# Patient Record
Sex: Male | Born: 1935 | Race: White | Hispanic: No | Marital: Married | Smoking: Never smoker
Health system: Southern US, Community
[De-identification: ages and names within clinical notes are randomized; demographics above are authoritative.]

## PROBLEM LIST (undated history)

## (undated) DIAGNOSIS — K219 Gastro-esophageal reflux disease without esophagitis: Secondary | ICD-10-CM

## (undated) DIAGNOSIS — M7989 Other specified soft tissue disorders: Secondary | ICD-10-CM

## (undated) DIAGNOSIS — G8929 Other chronic pain: Secondary | ICD-10-CM

## (undated) DIAGNOSIS — N183 Chronic kidney disease, stage 3 unspecified: Secondary | ICD-10-CM

## (undated) DIAGNOSIS — J90 Pleural effusion, not elsewhere classified: Secondary | ICD-10-CM

## (undated) DIAGNOSIS — J45909 Unspecified asthma, uncomplicated: Secondary | ICD-10-CM

## (undated) DIAGNOSIS — M549 Dorsalgia, unspecified: Secondary | ICD-10-CM

## (undated) DIAGNOSIS — R0600 Dyspnea, unspecified: Secondary | ICD-10-CM

## (undated) DIAGNOSIS — J479 Bronchiectasis, uncomplicated: Secondary | ICD-10-CM

---
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
## 2015-07-23 DIAGNOSIS — H5111 Convergence insufficiency: Secondary | ICD-10-CM | POA: Diagnosis not present

## 2015-07-23 DIAGNOSIS — H2513 Age-related nuclear cataract, bilateral: Secondary | ICD-10-CM | POA: Diagnosis not present

## 2015-07-23 DIAGNOSIS — H5052 Exophoria: Secondary | ICD-10-CM | POA: Diagnosis not present

## 2015-07-23 DIAGNOSIS — H43393 Other vitreous opacities, bilateral: Secondary | ICD-10-CM | POA: Diagnosis not present

## 2015-07-23 DIAGNOSIS — H5203 Hypermetropia, bilateral: Secondary | ICD-10-CM | POA: Diagnosis not present

## 2015-07-23 DIAGNOSIS — H18413 Arcus senilis, bilateral: Secondary | ICD-10-CM | POA: Diagnosis not present

## 2015-07-23 DIAGNOSIS — H25013 Cortical age-related cataract, bilateral: Secondary | ICD-10-CM | POA: Diagnosis not present

## 2015-07-23 DIAGNOSIS — H25041 Posterior subcapsular polar age-related cataract, right eye: Secondary | ICD-10-CM | POA: Diagnosis not present

## 2015-07-24 DIAGNOSIS — M5416 Radiculopathy, lumbar region: Secondary | ICD-10-CM | POA: Diagnosis not present

## 2015-07-24 DIAGNOSIS — M47817 Spondylosis without myelopathy or radiculopathy, lumbosacral region: Secondary | ICD-10-CM | POA: Diagnosis not present

## 2015-07-24 DIAGNOSIS — M4806 Spinal stenosis, lumbar region: Secondary | ICD-10-CM | POA: Diagnosis not present

## 2015-07-24 DIAGNOSIS — M6281 Muscle weakness (generalized): Secondary | ICD-10-CM | POA: Diagnosis not present

## 2015-08-21 DIAGNOSIS — I10 Essential (primary) hypertension: Secondary | ICD-10-CM | POA: Diagnosis not present

## 2015-08-21 DIAGNOSIS — G8929 Other chronic pain: Secondary | ICD-10-CM | POA: Diagnosis not present

## 2015-08-21 DIAGNOSIS — D696 Thrombocytopenia, unspecified: Secondary | ICD-10-CM | POA: Diagnosis not present

## 2015-08-21 DIAGNOSIS — E782 Mixed hyperlipidemia: Secondary | ICD-10-CM | POA: Diagnosis not present

## 2015-08-21 DIAGNOSIS — K219 Gastro-esophageal reflux disease without esophagitis: Secondary | ICD-10-CM | POA: Diagnosis not present

## 2015-08-21 DIAGNOSIS — M19012 Primary osteoarthritis, left shoulder: Secondary | ICD-10-CM | POA: Diagnosis not present

## 2015-08-21 DIAGNOSIS — M545 Low back pain: Secondary | ICD-10-CM | POA: Diagnosis not present

## 2015-08-21 DIAGNOSIS — N400 Enlarged prostate without lower urinary tract symptoms: Secondary | ICD-10-CM | POA: Diagnosis not present

## 2015-08-30 DIAGNOSIS — R7989 Other specified abnormal findings of blood chemistry: Secondary | ICD-10-CM | POA: Diagnosis not present

## 2015-08-30 DIAGNOSIS — D6489 Other specified anemias: Secondary | ICD-10-CM | POA: Diagnosis not present

## 2015-09-26 DIAGNOSIS — N183 Chronic kidney disease, stage 3 (moderate): Secondary | ICD-10-CM | POA: Diagnosis not present

## 2015-09-26 DIAGNOSIS — M7989 Other specified soft tissue disorders: Secondary | ICD-10-CM | POA: Diagnosis not present

## 2015-09-26 DIAGNOSIS — N400 Enlarged prostate without lower urinary tract symptoms: Secondary | ICD-10-CM | POA: Diagnosis not present

## 2015-09-26 DIAGNOSIS — I482 Chronic atrial fibrillation: Secondary | ICD-10-CM | POA: Diagnosis not present

## 2015-10-03 DIAGNOSIS — N183 Chronic kidney disease, stage 3 (moderate): Secondary | ICD-10-CM | POA: Diagnosis not present

## 2015-10-03 DIAGNOSIS — M7989 Other specified soft tissue disorders: Secondary | ICD-10-CM | POA: Diagnosis not present

## 2015-10-03 DIAGNOSIS — I129 Hypertensive chronic kidney disease with stage 1 through stage 4 chronic kidney disease, or unspecified chronic kidney disease: Secondary | ICD-10-CM | POA: Diagnosis not present

## 2015-10-22 DIAGNOSIS — I10 Essential (primary) hypertension: Secondary | ICD-10-CM | POA: Diagnosis not present

## 2015-10-22 DIAGNOSIS — D649 Anemia, unspecified: Secondary | ICD-10-CM | POA: Diagnosis not present

## 2015-10-22 DIAGNOSIS — I4891 Unspecified atrial fibrillation: Secondary | ICD-10-CM | POA: Diagnosis not present

## 2015-10-22 DIAGNOSIS — I499 Cardiac arrhythmia, unspecified: Secondary | ICD-10-CM | POA: Diagnosis not present

## 2015-10-22 DIAGNOSIS — N400 Enlarged prostate without lower urinary tract symptoms: Secondary | ICD-10-CM | POA: Diagnosis not present

## 2015-10-22 DIAGNOSIS — R627 Adult failure to thrive: Secondary | ICD-10-CM | POA: Diagnosis not present

## 2015-10-22 DIAGNOSIS — M150 Primary generalized (osteo)arthritis: Secondary | ICD-10-CM | POA: Diagnosis not present

## 2015-10-22 DIAGNOSIS — R001 Bradycardia, unspecified: Secondary | ICD-10-CM | POA: Diagnosis not present

## 2015-10-22 DIAGNOSIS — R002 Palpitations: Secondary | ICD-10-CM | POA: Diagnosis not present

## 2015-10-22 DIAGNOSIS — R609 Edema, unspecified: Secondary | ICD-10-CM | POA: Diagnosis not present

## 2015-10-29 DIAGNOSIS — N138 Other obstructive and reflux uropathy: Secondary | ICD-10-CM | POA: Diagnosis not present

## 2015-10-29 DIAGNOSIS — N401 Enlarged prostate with lower urinary tract symptoms: Secondary | ICD-10-CM | POA: Diagnosis not present

## 2015-10-30 DIAGNOSIS — N401 Enlarged prostate with lower urinary tract symptoms: Secondary | ICD-10-CM | POA: Diagnosis not present

## 2015-10-30 DIAGNOSIS — N138 Other obstructive and reflux uropathy: Secondary | ICD-10-CM | POA: Diagnosis not present

## 2015-11-04 DIAGNOSIS — E7801 Familial hypercholesterolemia: Secondary | ICD-10-CM | POA: Diagnosis not present

## 2015-11-04 DIAGNOSIS — R001 Bradycardia, unspecified: Secondary | ICD-10-CM | POA: Diagnosis not present

## 2015-11-04 DIAGNOSIS — I10 Essential (primary) hypertension: Secondary | ICD-10-CM | POA: Diagnosis not present

## 2015-11-04 DIAGNOSIS — M5416 Radiculopathy, lumbar region: Secondary | ICD-10-CM | POA: Diagnosis not present

## 2015-11-06 DIAGNOSIS — N401 Enlarged prostate with lower urinary tract symptoms: Secondary | ICD-10-CM | POA: Diagnosis not present

## 2015-12-05 DIAGNOSIS — R071 Chest pain on breathing: Secondary | ICD-10-CM | POA: Diagnosis not present

## 2015-12-05 DIAGNOSIS — J918 Pleural effusion in other conditions classified elsewhere: Secondary | ICD-10-CM | POA: Diagnosis not present

## 2015-12-09 DIAGNOSIS — R071 Chest pain on breathing: Secondary | ICD-10-CM | POA: Diagnosis not present

## 2015-12-10 DIAGNOSIS — M4727 Other spondylosis with radiculopathy, lumbosacral region: Secondary | ICD-10-CM | POA: Diagnosis not present

## 2015-12-10 DIAGNOSIS — M4806 Spinal stenosis, lumbar region: Secondary | ICD-10-CM | POA: Diagnosis not present

## 2015-12-10 DIAGNOSIS — M4310 Spondylolisthesis, site unspecified: Secondary | ICD-10-CM | POA: Diagnosis not present

## 2015-12-10 DIAGNOSIS — M5416 Radiculopathy, lumbar region: Secondary | ICD-10-CM | POA: Diagnosis not present

## 2015-12-12 DIAGNOSIS — R0989 Other specified symptoms and signs involving the circulatory and respiratory systems: Secondary | ICD-10-CM | POA: Diagnosis not present

## 2015-12-12 DIAGNOSIS — R918 Other nonspecific abnormal finding of lung field: Secondary | ICD-10-CM | POA: Diagnosis not present

## 2015-12-12 DIAGNOSIS — J90 Pleural effusion, not elsewhere classified: Secondary | ICD-10-CM | POA: Diagnosis not present

## 2015-12-12 DIAGNOSIS — R938 Abnormal findings on diagnostic imaging of other specified body structures: Secondary | ICD-10-CM | POA: Diagnosis not present

## 2015-12-12 DIAGNOSIS — R911 Solitary pulmonary nodule: Secondary | ICD-10-CM | POA: Diagnosis not present

## 2015-12-19 DIAGNOSIS — M47816 Spondylosis without myelopathy or radiculopathy, lumbar region: Secondary | ICD-10-CM | POA: Diagnosis not present

## 2015-12-19 DIAGNOSIS — M4727 Other spondylosis with radiculopathy, lumbosacral region: Secondary | ICD-10-CM | POA: Diagnosis not present

## 2015-12-19 DIAGNOSIS — M4806 Spinal stenosis, lumbar region: Secondary | ICD-10-CM | POA: Diagnosis not present

## 2015-12-19 DIAGNOSIS — M5416 Radiculopathy, lumbar region: Secondary | ICD-10-CM | POA: Diagnosis not present

## 2015-12-19 DIAGNOSIS — M47819 Spondylosis without myelopathy or radiculopathy, site unspecified: Secondary | ICD-10-CM | POA: Diagnosis not present

## 2015-12-19 DIAGNOSIS — M4310 Spondylolisthesis, site unspecified: Secondary | ICD-10-CM | POA: Diagnosis not present

## 2015-12-26 DIAGNOSIS — R0989 Other specified symptoms and signs involving the circulatory and respiratory systems: Secondary | ICD-10-CM | POA: Diagnosis not present

## 2015-12-26 DIAGNOSIS — R918 Other nonspecific abnormal finding of lung field: Secondary | ICD-10-CM | POA: Diagnosis not present

## 2015-12-27 DIAGNOSIS — R001 Bradycardia, unspecified: Secondary | ICD-10-CM | POA: Diagnosis not present

## 2015-12-27 DIAGNOSIS — I10 Essential (primary) hypertension: Secondary | ICD-10-CM | POA: Diagnosis not present

## 2015-12-31 DIAGNOSIS — R0989 Other specified symptoms and signs involving the circulatory and respiratory systems: Secondary | ICD-10-CM | POA: Diagnosis not present

## 2015-12-31 DIAGNOSIS — R918 Other nonspecific abnormal finding of lung field: Secondary | ICD-10-CM | POA: Diagnosis not present

## 2015-12-31 DIAGNOSIS — J471 Bronchiectasis with (acute) exacerbation: Secondary | ICD-10-CM | POA: Diagnosis not present

## 2015-12-31 DIAGNOSIS — J90 Pleural effusion, not elsewhere classified: Secondary | ICD-10-CM | POA: Diagnosis not present

## 2016-01-03 DIAGNOSIS — J471 Bronchiectasis with (acute) exacerbation: Secondary | ICD-10-CM | POA: Diagnosis not present

## 2016-01-06 DIAGNOSIS — N183 Chronic kidney disease, stage 3 (moderate): Secondary | ICD-10-CM | POA: Diagnosis not present

## 2016-01-06 DIAGNOSIS — I129 Hypertensive chronic kidney disease with stage 1 through stage 4 chronic kidney disease, or unspecified chronic kidney disease: Secondary | ICD-10-CM | POA: Diagnosis not present

## 2016-01-08 DIAGNOSIS — M4806 Spinal stenosis, lumbar region: Secondary | ICD-10-CM | POA: Diagnosis not present

## 2016-01-08 DIAGNOSIS — M4310 Spondylolisthesis, site unspecified: Secondary | ICD-10-CM | POA: Diagnosis not present

## 2016-01-08 DIAGNOSIS — M4727 Other spondylosis with radiculopathy, lumbosacral region: Secondary | ICD-10-CM | POA: Diagnosis not present

## 2016-01-08 DIAGNOSIS — M5416 Radiculopathy, lumbar region: Secondary | ICD-10-CM | POA: Diagnosis not present

## 2016-01-14 DIAGNOSIS — J471 Bronchiectasis with (acute) exacerbation: Secondary | ICD-10-CM | POA: Diagnosis not present

## 2016-01-14 DIAGNOSIS — J90 Pleural effusion, not elsewhere classified: Secondary | ICD-10-CM | POA: Diagnosis not present

## 2016-01-14 DIAGNOSIS — N183 Chronic kidney disease, stage 3 (moderate): Secondary | ICD-10-CM | POA: Diagnosis not present

## 2016-01-14 DIAGNOSIS — K219 Gastro-esophageal reflux disease without esophagitis: Secondary | ICD-10-CM | POA: Diagnosis not present

## 2016-01-21 DIAGNOSIS — R531 Weakness: Secondary | ICD-10-CM | POA: Diagnosis not present

## 2016-01-21 DIAGNOSIS — R634 Abnormal weight loss: Secondary | ICD-10-CM | POA: Diagnosis not present

## 2016-01-21 DIAGNOSIS — I10 Essential (primary) hypertension: Secondary | ICD-10-CM | POA: Diagnosis not present

## 2016-01-21 DIAGNOSIS — J90 Pleural effusion, not elsewhere classified: Secondary | ICD-10-CM | POA: Diagnosis not present

## 2016-01-21 DIAGNOSIS — N400 Enlarged prostate without lower urinary tract symptoms: Secondary | ICD-10-CM | POA: Diagnosis not present

## 2016-01-21 DIAGNOSIS — E860 Dehydration: Secondary | ICD-10-CM | POA: Diagnosis not present

## 2016-01-21 DIAGNOSIS — R627 Adult failure to thrive: Secondary | ICD-10-CM | POA: Diagnosis not present

## 2016-01-21 DIAGNOSIS — J471 Bronchiectasis with (acute) exacerbation: Secondary | ICD-10-CM | POA: Diagnosis not present

## 2016-01-21 DIAGNOSIS — D6489 Other specified anemias: Secondary | ICD-10-CM | POA: Diagnosis not present

## 2016-01-21 DIAGNOSIS — K219 Gastro-esophageal reflux disease without esophagitis: Secondary | ICD-10-CM | POA: Diagnosis not present

## 2016-01-21 DIAGNOSIS — R630 Anorexia: Secondary | ICD-10-CM | POA: Diagnosis not present

## 2016-01-21 DIAGNOSIS — J029 Acute pharyngitis, unspecified: Secondary | ICD-10-CM | POA: Diagnosis not present

## 2016-01-21 DIAGNOSIS — I481 Persistent atrial fibrillation: Secondary | ICD-10-CM | POA: Diagnosis not present

## 2016-01-23 DIAGNOSIS — J471 Bronchiectasis with (acute) exacerbation: Secondary | ICD-10-CM | POA: Diagnosis not present

## 2016-01-27 DIAGNOSIS — M7662 Achilles tendinitis, left leg: Secondary | ICD-10-CM | POA: Diagnosis not present

## 2016-01-27 DIAGNOSIS — M5416 Radiculopathy, lumbar region: Secondary | ICD-10-CM | POA: Diagnosis not present

## 2016-01-27 DIAGNOSIS — M4727 Other spondylosis with radiculopathy, lumbosacral region: Secondary | ICD-10-CM | POA: Diagnosis not present

## 2016-01-27 DIAGNOSIS — M4310 Spondylolisthesis, site unspecified: Secondary | ICD-10-CM | POA: Diagnosis not present

## 2016-01-27 DIAGNOSIS — M4806 Spinal stenosis, lumbar region: Secondary | ICD-10-CM | POA: Diagnosis not present

## 2016-01-27 DIAGNOSIS — M5137 Other intervertebral disc degeneration, lumbosacral region: Secondary | ICD-10-CM | POA: Diagnosis not present

## 2016-02-05 DIAGNOSIS — J90 Pleural effusion, not elsewhere classified: Secondary | ICD-10-CM | POA: Diagnosis not present

## 2016-02-05 DIAGNOSIS — I481 Persistent atrial fibrillation: Secondary | ICD-10-CM | POA: Diagnosis not present

## 2016-02-05 DIAGNOSIS — J471 Bronchiectasis with (acute) exacerbation: Secondary | ICD-10-CM | POA: Diagnosis not present

## 2016-02-05 DIAGNOSIS — N183 Chronic kidney disease, stage 3 (moderate): Secondary | ICD-10-CM | POA: Diagnosis not present

## 2016-02-12 DIAGNOSIS — I129 Hypertensive chronic kidney disease with stage 1 through stage 4 chronic kidney disease, or unspecified chronic kidney disease: Secondary | ICD-10-CM | POA: Diagnosis not present

## 2016-02-12 DIAGNOSIS — R600 Localized edema: Secondary | ICD-10-CM | POA: Diagnosis not present

## 2016-02-12 DIAGNOSIS — N183 Chronic kidney disease, stage 3 (moderate): Secondary | ICD-10-CM | POA: Diagnosis not present

## 2016-02-12 DIAGNOSIS — E8809 Other disorders of plasma-protein metabolism, not elsewhere classified: Secondary | ICD-10-CM | POA: Diagnosis not present

## 2016-02-20 DIAGNOSIS — R1310 Dysphagia, unspecified: Secondary | ICD-10-CM | POA: Diagnosis not present

## 2016-02-21 DIAGNOSIS — K2970 Gastritis, unspecified, without bleeding: Secondary | ICD-10-CM | POA: Diagnosis not present

## 2016-02-21 DIAGNOSIS — Z791 Long term (current) use of non-steroidal anti-inflammatories (NSAID): Secondary | ICD-10-CM | POA: Diagnosis not present

## 2016-02-21 DIAGNOSIS — K2950 Unspecified chronic gastritis without bleeding: Secondary | ICD-10-CM | POA: Diagnosis not present

## 2016-02-21 DIAGNOSIS — R1310 Dysphagia, unspecified: Secondary | ICD-10-CM | POA: Diagnosis not present

## 2016-02-21 DIAGNOSIS — K2901 Acute gastritis with bleeding: Secondary | ICD-10-CM | POA: Diagnosis not present

## 2016-02-25 DIAGNOSIS — M4806 Spinal stenosis, lumbar region: Secondary | ICD-10-CM | POA: Diagnosis not present

## 2016-02-25 DIAGNOSIS — M4316 Spondylolisthesis, lumbar region: Secondary | ICD-10-CM | POA: Diagnosis not present

## 2016-02-25 DIAGNOSIS — I10 Essential (primary) hypertension: Secondary | ICD-10-CM | POA: Diagnosis not present

## 2016-02-26 DIAGNOSIS — R1313 Dysphagia, pharyngeal phase: Secondary | ICD-10-CM | POA: Diagnosis not present

## 2016-02-26 DIAGNOSIS — K219 Gastro-esophageal reflux disease without esophagitis: Secondary | ICD-10-CM | POA: Diagnosis not present

## 2016-02-26 DIAGNOSIS — R1310 Dysphagia, unspecified: Secondary | ICD-10-CM | POA: Diagnosis not present

## 2016-02-27 DIAGNOSIS — K2901 Acute gastritis with bleeding: Secondary | ICD-10-CM | POA: Diagnosis not present

## 2016-02-27 DIAGNOSIS — R1313 Dysphagia, pharyngeal phase: Secondary | ICD-10-CM | POA: Diagnosis not present

## 2016-03-05 DIAGNOSIS — R05 Cough: Secondary | ICD-10-CM | POA: Diagnosis not present

## 2016-03-05 DIAGNOSIS — T17998A Other foreign object in respiratory tract, part unspecified causing other injury, initial encounter: Secondary | ICD-10-CM | POA: Diagnosis not present

## 2016-03-05 DIAGNOSIS — R1310 Dysphagia, unspecified: Secondary | ICD-10-CM | POA: Diagnosis not present

## 2016-03-09 DIAGNOSIS — R600 Localized edema: Secondary | ICD-10-CM | POA: Diagnosis not present

## 2016-03-09 DIAGNOSIS — N183 Chronic kidney disease, stage 3 (moderate): Secondary | ICD-10-CM | POA: Diagnosis not present

## 2016-03-09 DIAGNOSIS — I129 Hypertensive chronic kidney disease with stage 1 through stage 4 chronic kidney disease, or unspecified chronic kidney disease: Secondary | ICD-10-CM | POA: Diagnosis not present

## 2016-03-12 DIAGNOSIS — R1313 Dysphagia, pharyngeal phase: Secondary | ICD-10-CM | POA: Diagnosis not present

## 2016-03-19 DIAGNOSIS — R1313 Dysphagia, pharyngeal phase: Secondary | ICD-10-CM | POA: Diagnosis not present

## 2016-03-24 ENCOUNTER — Other Ambulatory Visit: Payer: Self-pay | Admitting: Neurosurgery

## 2016-03-24 DIAGNOSIS — M5137 Other intervertebral disc degeneration, lumbosacral region: Secondary | ICD-10-CM | POA: Diagnosis not present

## 2016-03-24 DIAGNOSIS — M4316 Spondylolisthesis, lumbar region: Secondary | ICD-10-CM | POA: Diagnosis not present

## 2016-03-26 DIAGNOSIS — T17998A Other foreign object in respiratory tract, part unspecified causing other injury, initial encounter: Secondary | ICD-10-CM | POA: Diagnosis not present

## 2016-03-26 DIAGNOSIS — K2901 Acute gastritis with bleeding: Secondary | ICD-10-CM | POA: Diagnosis not present

## 2016-03-26 DIAGNOSIS — R1313 Dysphagia, pharyngeal phase: Secondary | ICD-10-CM | POA: Diagnosis not present

## 2016-04-02 DIAGNOSIS — N183 Chronic kidney disease, stage 3 (moderate): Secondary | ICD-10-CM | POA: Diagnosis not present

## 2016-04-06 DIAGNOSIS — I129 Hypertensive chronic kidney disease with stage 1 through stage 4 chronic kidney disease, or unspecified chronic kidney disease: Secondary | ICD-10-CM | POA: Diagnosis not present

## 2016-04-06 DIAGNOSIS — R600 Localized edema: Secondary | ICD-10-CM | POA: Diagnosis not present

## 2016-04-06 DIAGNOSIS — N183 Chronic kidney disease, stage 3 (moderate): Secondary | ICD-10-CM | POA: Diagnosis not present

## 2016-04-06 DIAGNOSIS — N179 Acute kidney failure, unspecified: Secondary | ICD-10-CM | POA: Diagnosis not present

## 2016-04-08 ENCOUNTER — Inpatient Hospital Stay (HOSPITAL_COMMUNITY)
Admission: RE | Admit: 2016-04-08 | Discharge: 2016-04-08 | Disposition: A | Discharge status: Home / Self Care | Payer: Self-pay | Source: Ambulatory Visit

## 2016-04-08 NOTE — Pre-Procedure Instructions (Signed)
    Mario Allen  04/08/2016      [facility], [place] [address] [place] [address] Phone: [phone] Fax: [phone]    Your procedure is scheduled on October 20.  Report to [facility] Admitting at 12:30 P.M.  Call this number if you have problems the morning of surgery:  [phone]   Remember:  Do not eat food or drink liquids after midnight.  Take these medicines the morning of surgery with A SIP OF WATER : azithromycin (ZITHROMAX) , finasteride (PROSCAR), metolazone  (ZAROXOLYN), pantoprazole (PROTONIX), IF NEEDED- fluticasone (FLOVENT HFA) BRING INHALER WITH YOU  STOP aspirin, herbal medications, NSAID'S (advil, aleve, ibuprofen, motrin)    Do not wear jewelry.  Do not wear lotions, powders, or colognes, or deoderant.  Do not shave 48 hours prior to surgery.  Men may shave face and neck.  Do not bring valuables to the hospital.  [facility] is not responsible for any belongings or valuables.  Contacts, dentures or bridgework may not be worn into surgery.  Leave your suitcase in the car.  After surgery it may be brought to your room.  For patients admitted to the hospital, discharge time will be determined by your treatment team.  Patients discharged the day of surgery will not be allowed to drive home.   Name and phone number of your driver:    Special instructions:  Preparing for surgery  Please read over the following fact sheets that you were given.

## 2016-04-14 ENCOUNTER — Encounter (HOSPITAL_COMMUNITY): Payer: Self-pay

## 2016-04-14 ENCOUNTER — Encounter (HOSPITAL_COMMUNITY)
Admission: RE | Admit: 2016-04-14 | Discharge: 2016-04-14 | Disposition: A | Discharge status: Home / Self Care | Payer: Medicare Other | Source: Ambulatory Visit | Attending: Neurosurgery | Admitting: Neurosurgery

## 2016-04-14 DIAGNOSIS — M4316 Spondylolisthesis, lumbar region: Secondary | ICD-10-CM

## 2016-04-14 DIAGNOSIS — Z0183 Encounter for blood typing: Secondary | ICD-10-CM

## 2016-04-14 DIAGNOSIS — Z01812 Encounter for preprocedural laboratory examination: Secondary | ICD-10-CM | POA: Insufficient documentation

## 2016-04-14 HISTORY — DX: Unspecified asthma, uncomplicated: J45.909

## 2016-04-14 HISTORY — DX: Other specified soft tissue disorders: M79.89

## 2016-04-14 HISTORY — DX: Gastro-esophageal reflux disease without esophagitis: K21.9

## 2016-04-14 HISTORY — DX: Dyspnea, unspecified: R06.00

## 2016-04-14 HISTORY — DX: Dorsalgia, unspecified: M54.9

## 2016-04-14 HISTORY — DX: Other chronic pain: G89.29

## 2016-04-14 LAB — SURGICAL PCR SCREEN
MRSA, PCR: NEGATIVE
Staphylococcus aureus: NEGATIVE

## 2016-04-14 LAB — CBC
HCT: 36.6 % — ABNORMAL LOW (ref 39.0–52.0)
Hemoglobin: 11.8 g/dL — ABNORMAL LOW (ref 13.0–17.0)
MCH: 27.6 pg (ref 26.0–34.0)
MCHC: 32.2 g/dL (ref 30.0–36.0)
MCV: 85.7 fL (ref 78.0–100.0)
PLATELETS: 208 10*3/uL (ref 150–400)
RBC: 4.27 MIL/uL (ref 4.22–5.81)
RDW: 17.1 % — AB (ref 11.5–15.5)
WBC: 11.9 10*3/uL — ABNORMAL HIGH (ref 4.0–10.5)

## 2016-04-14 LAB — TYPE AND SCREEN
ABO/RH(D): O POS
ANTIBODY SCREEN: NEGATIVE

## 2016-04-14 LAB — BASIC METABOLIC PANEL
ANION GAP: 14 (ref 5–15)
BUN: 75 mg/dL — ABNORMAL HIGH (ref 6–20)
CALCIUM: 9.1 mg/dL (ref 8.9–10.3)
CO2: 30 mmol/L (ref 22–32)
CREATININE: 3.03 mg/dL — AB (ref 0.61–1.24)
Chloride: 96 mmol/L — ABNORMAL LOW (ref 101–111)
GFR, EST AFRICAN AMERICAN: 20 mL/min — AB (ref >60)
GFR, EST NON AFRICAN AMERICAN: 17 mL/min — AB (ref >60)
GLUCOSE: 128 mg/dL — AB (ref 65–99)
Potassium: 2.4 mmol/L — CL (ref 3.5–5.1)
Sodium: 140 mmol/L (ref 135–145)

## 2016-04-14 LAB — ABO/RH: ABO/RH(D): O POS

## 2016-04-14 NOTE — Progress Notes (Signed)
CRITICAL VALUE ALERT  Critical value received:  Potassium 2.4  Date of notification:  04/14/16  Time of notification:  10:18 AM  Critical value read back:Yes.    Nurse who received alert:  Fabian Sharp, RN  MD notified (1st page):  Dr. Saintclair Halsted  Time of first page:  10:18 AM  MD notified (2nd page):  Time of second page:  Responding MD:  Saintclair Halsted  Time MD responded:  10:28 AM

## 2016-04-14 NOTE — Progress Notes (Signed)
Spoke with Lorriane Shire with Dr Saintclair Halsted, notified her of K being 2.4 and Creatinine 3.03.  Lorriane Shire stated that they will address both of those lab values

## 2016-04-14 NOTE — Pre-Procedure Instructions (Signed)
Mario Allen  04/14/2016      [facility], [place] [address] [place] [address] Phone: [phone] Fax: [phone]    Your procedure is scheduled on October 27  Report to [facility] at 1100 A.M.  Call this number if you have problems the morning of surgery:  [phone]   Remember:  Do not eat food or drink liquids after midnight.   Take these medicines the morning of surgery with A SIP OF WATER albuterol,  finasteride (PROSCAR), fluticasone (FLOVENT HFA), pantoprazole (PROTONIX)  7 days prior to surgery STOP taking any Aspirin, Aleve, Naproxen, Ibuprofen, Motrin, Advil, Goody's, BC's, all herbal medications, fish oil, and all vitamins    Do not wear jewelry.  Do not wear lotions, powders, or cologne, or deoderant.   Men may shave face and neck.  Do not bring valuables to the hospital.  [facility] is not responsible for any belongings or valuables.  Contacts, dentures or bridgework may not be worn into surgery.  Leave your suitcase in the car.  After surgery it may be brought to your room.  For patients admitted to the hospital, discharge time will be determined by your treatment team.  Patients discharged the day of surgery will not be allowed to drive home.    Special instructions:   [facility]- Preparing For Surgery  Before surgery, you can play an important role. Because skin is not sterile, your skin needs to be as free of germs as possible. You can reduce the number of germs on your skin by washing with CHG (chlorahexidine gluconate) Soap before surgery.  CHG is an antiseptic cleaner which kills germs and bonds with the skin to continue killing germs even after washing.  Please do not use if you have an allergy to CHG or antibacterial soaps. If your skin becomes reddened/irritated stop using the CHG.  Do not shave (including legs and underarms) for at least 48 hours prior to first  CHG shower. It is OK to shave your face.  Please follow these instructions carefully.   1. Shower the NIGHT BEFORE SURGERY and the MORNING OF SURGERY with CHG.   2. If you chose to wash your hair, wash your hair first as usual with your normal shampoo.  3. After you shampoo, rinse your hair and body thoroughly to remove the shampoo.  4. Use CHG as you would any other liquid soap. You can apply CHG directly to the skin and wash gently with a scrungie or a clean washcloth.   5. Apply the CHG Soap to your body ONLY FROM THE NECK DOWN.  Do not use on open wounds or open sores. Avoid contact with your eyes, ears, mouth and genitals (private parts). Wash genitals (private parts) with your normal soap.  6. Wash thoroughly, paying special attention to the area where your surgery will be performed.  7. Thoroughly rinse your body with warm water from the neck down.  8. DO NOT shower/wash with your normal soap after using and rinsing off the CHG Soap.  9. Pat yourself dry with a CLEAN TOWEL.   10. Wear CLEAN PAJAMAS   11. Place CLEAN SHEETS on your bed the night of your first shower and DO NOT SLEEP WITH PETS.    Day of Surgery: Do not apply any deodorants/lotions. Please wear clean clothes to the hospital/surgery center.      Please read over the following fact sheets  that you were given.

## 2016-04-14 NOTE — Progress Notes (Signed)
PCP - Alehegn Asres Cardiologist - [place] [facility]  Chest x-ray - not needed EKG - 11/04/15 - requesting tracing will need Day of surgery if not recieved Stress Test - unsure requesting if he has had one ECHO - 12/27/15 - requesting results from [place] denies  Will send to anesthesia for review to make sure records received are ok for surgery.  Patient may need clearance for surgery from a cardiac standpoint.  Patient denies any cardiac complications when reviewed medical history    Patient denies shortness of breath, fever, cough and chest pain at PAT appointment

## 2016-04-15 ENCOUNTER — Encounter (HOSPITAL_COMMUNITY): Payer: Self-pay | Admitting: Emergency Medicine

## 2016-04-15 NOTE — Progress Notes (Signed)
Patient called for question about medications.  Spoke with patient about inhaler and instructed him to use it if he needed the the morning since he doesn't use it on a daily basis.  Asked Patient about potassium.  Patient stated that someone called in a prescription for potassium for him

## 2016-04-15 NOTE — Progress Notes (Addendum)
Anesthesia Chart Review:  Pt is an 82 year old male scheduled for L4-5 PLIF on 04/17/2016 with Kary Kos, MD.   - PCP is Wallene Dales, MD.  - Nephrologist is Red Christians, MD - Has seen Wetzel Bjornstad, MD with cardiology x1 11/04/15 for bradycardia - Pulmonologist is Francena Hanly, MD, last office visit 02/05/16 indicates pt has loculated pleural effusion not amenable to thoracentesis.   (Notes for all above providers are in care everywhere)  PMH includes:  HTN, CKD (stage 3), anemia, bronchiectasis, right pleural effusion  Medications include: albuterol, zithromax, iron, flovent, lasix, metolazone, protonix.   Preoperative labs reviewed.   - K 2.4. K was 3.6 on 04/02/16 (care everywhere) - Cr 3.03, BUN 75.  Lorriane Shire in Dr. Windy Carina office notified of abnormal results.  - notes by Dr. Neta Ehlers 04/06/16 indicate pt recently suffered an acute kidney injury in September with Cr ~2.5, but was recovering and Cr was 2.01 on 04/02/16. Baseline Cr appears to be ~1.3.    CXR 12/12/15 (care everywhere):  1. Small right pleural effusion. Possible infiltrate or mass in the right infrahilar region and inferior aspect of the right upper lobe. There are underlying emphysematous changes. 2. No evidence of CHF.Aortic atherosclerosis. 3. If the patient has symptoms of pneumonia, PA and lateral chest  CT chest 12/12/15 (care everywhere): 1. Small, multiloculated right-sided pleural effusion. 2. Anterior inferior right middle lobe collapse/consolidative change with suggestion of bronchiectasis. Favor infection, possibly superimposed upon chronic post infectious scarring. 3. Micro nodularity, most apparent in the right lower lobe. Suspicious for atypical infection, of indeterminate acuity. Larger pulmonary nodules, including the left lower lobe, are most likely subpleural lymph nodes. No follow-up needed if patient is low-risk. 4.Aortic atherosclerosis.  EKG 11/04/15: Sinus rhythm   Echo 12/27/15:  1.  Technically adequate study 2. All chambers are normal in size 3. Mild LVH with normal systolic function. Grade 1 diastolic dysfunction. EF 55-60% 4. Sclerotic aortic valve. Mild AI. 5. Mild MR, TR.  Spoke to [place] in Dr. Windy Carina office. Pt's nephrologist has given ok to proceed with surgery if renal function is improved by morning of surgery. Dr. Neta Ehlers, reduced lasix to once daily and discontinue metolazone entirely. The pt was also prescribed potassium.  Will recheck a BMET DOS.   If labs acceptable DOS, I anticipate pt can proceed as scheduled.   Willeen Cass, FNP-BC [facility] Short Stay Surgical [facility]/Anesthesiology Phone: [phone] 04/16/2016 2:55 PM

## 2016-04-16 DIAGNOSIS — M7138 Other bursal cyst, other site: Secondary | ICD-10-CM | POA: Diagnosis not present

## 2016-04-16 DIAGNOSIS — M4316 Spondylolisthesis, lumbar region: Secondary | ICD-10-CM | POA: Diagnosis not present

## 2016-04-16 DIAGNOSIS — M5137 Other intervertebral disc degeneration, lumbosacral region: Secondary | ICD-10-CM | POA: Diagnosis not present

## 2016-04-16 DIAGNOSIS — M4807 Spinal stenosis, lumbosacral region: Secondary | ICD-10-CM | POA: Diagnosis not present

## 2016-04-17 ENCOUNTER — Inpatient Hospital Stay (HOSPITAL_COMMUNITY): Payer: Medicare Other | Admitting: Emergency Medicine

## 2016-04-17 ENCOUNTER — Inpatient Hospital Stay (HOSPITAL_COMMUNITY): Payer: Medicare Other

## 2016-04-17 ENCOUNTER — Encounter (HOSPITAL_COMMUNITY)
Admission: RE | Disposition: A | Discharge status: Home Health | Payer: Self-pay | Source: Ambulatory Visit | Attending: Neurosurgery

## 2016-04-17 ENCOUNTER — Encounter (HOSPITAL_COMMUNITY): Payer: Self-pay | Admitting: *Deleted

## 2016-04-17 ENCOUNTER — Inpatient Hospital Stay (HOSPITAL_COMMUNITY)
Admission: RE | Admit: 2016-04-17 | Discharge: 2016-04-20 | DRG: 460 | Disposition: A | Discharge status: Home Health | Payer: Medicare Other | Source: Ambulatory Visit | Attending: Neurosurgery | Admitting: Neurosurgery

## 2016-04-17 DIAGNOSIS — J479 Bronchiectasis, uncomplicated: Secondary | ICD-10-CM | POA: Diagnosis present

## 2016-04-17 DIAGNOSIS — M4326 Fusion of spine, lumbar region: Secondary | ICD-10-CM | POA: Diagnosis not present

## 2016-04-17 DIAGNOSIS — Z79899 Other long term (current) drug therapy: Secondary | ICD-10-CM

## 2016-04-17 DIAGNOSIS — Z792 Long term (current) use of antibiotics: Secondary | ICD-10-CM

## 2016-04-17 DIAGNOSIS — R2689 Other abnormalities of gait and mobility: Secondary | ICD-10-CM

## 2016-04-17 DIAGNOSIS — K219 Gastro-esophageal reflux disease without esophagitis: Secondary | ICD-10-CM | POA: Diagnosis present

## 2016-04-17 DIAGNOSIS — Z419 Encounter for procedure for purposes other than remedying health state, unspecified: Secondary | ICD-10-CM

## 2016-04-17 DIAGNOSIS — J45909 Unspecified asthma, uncomplicated: Secondary | ICD-10-CM | POA: Diagnosis present

## 2016-04-17 DIAGNOSIS — Z7951 Long term (current) use of inhaled steroids: Secondary | ICD-10-CM

## 2016-04-17 DIAGNOSIS — M48062 Spinal stenosis, lumbar region with neurogenic claudication: Principal | ICD-10-CM | POA: Diagnosis present

## 2016-04-17 DIAGNOSIS — N183 Chronic kidney disease, stage 3 (moderate): Secondary | ICD-10-CM | POA: Diagnosis present

## 2016-04-17 DIAGNOSIS — M549 Dorsalgia, unspecified: Secondary | ICD-10-CM | POA: Diagnosis not present

## 2016-04-17 DIAGNOSIS — M4316 Spondylolisthesis, lumbar region: Secondary | ICD-10-CM | POA: Diagnosis present

## 2016-04-17 HISTORY — DX: Pleural effusion, not elsewhere classified: J90

## 2016-04-17 HISTORY — DX: Bronchiectasis, uncomplicated: J47.9

## 2016-04-17 HISTORY — DX: Chronic kidney disease, stage 3 (moderate): N18.3

## 2016-04-17 HISTORY — DX: Chronic kidney disease, stage 3 unspecified: N18.30

## 2016-04-17 LAB — BASIC METABOLIC PANEL
ANION GAP: 10 (ref 5–15)
BUN: 67 mg/dL — ABNORMAL HIGH (ref 6–20)
CALCIUM: 9.3 mg/dL (ref 8.9–10.3)
CO2: 27 mmol/L (ref 22–32)
Chloride: 105 mmol/L (ref 101–111)
Creatinine, Ser: 2.25 mg/dL — ABNORMAL HIGH (ref 0.61–1.24)
GFR, EST AFRICAN AMERICAN: 28 mL/min — AB (ref >60)
GFR, EST NON AFRICAN AMERICAN: 25 mL/min — AB (ref >60)
Glucose, Bld: 100 mg/dL — ABNORMAL HIGH (ref 65–99)
POTASSIUM: 3.8 mmol/L (ref 3.5–5.1)
SODIUM: 142 mmol/L (ref 135–145)

## 2016-04-17 SURGERY — POSTERIOR LUMBAR FUSION 1 LEVEL
Anesthesia: General | Site: Spine Lumbar

## 2016-04-17 MED ORDER — LIDOCAINE-EPINEPHRINE 1 %-1:100000 IJ SOLN
INTRAMUSCULAR | Status: AC
  Filled 2016-04-17: qty 1

## 2016-04-17 MED ORDER — ONDANSETRON HCL 4 MG/2ML IJ SOLN
INTRAMUSCULAR | Status: AC
  Filled 2016-04-17: qty 8

## 2016-04-17 MED ORDER — SODIUM CHLORIDE 0.9 % IR SOLN
Status: DC | PRN
  Administered 2016-04-17: 17:00:00

## 2016-04-17 MED ORDER — CHLORHEXIDINE GLUCONATE CLOTH 2 % EX PADS: 6.0000 | MEDICATED_PAD | Freq: Once | CUTANEOUS | Status: DC

## 2016-04-17 MED ORDER — ALBUTEROL SULFATE (2.5 MG/3ML) 0.083% IN NEBU: 3.0000 mL | INHALATION_SOLUTION | Freq: Four times a day (QID) | RESPIRATORY_TRACT | Status: DC | PRN

## 2016-04-17 MED ORDER — CEFAZOLIN SODIUM-DEXTROSE 2-4 GM/100ML-% IV SOLN
2.0000 g | Freq: Two times a day (BID) | INTRAVENOUS | Status: AC
  Administered 2016-04-18 (×2): 2 g via INTRAVENOUS
  Filled 2016-04-17 (×2): qty 100

## 2016-04-17 MED ORDER — LIDOCAINE HCL (CARDIAC) 20 MG/ML IV SOLN
INTRAVENOUS | Status: DC | PRN
  Administered 2016-04-17: 50 mg via INTRAVENOUS

## 2016-04-17 MED ORDER — PROPOFOL 10 MG/ML IV BOLUS
INTRAVENOUS | Status: AC
  Filled 2016-04-17: qty 20

## 2016-04-17 MED ORDER — SUGAMMADEX SODIUM 200 MG/2ML IV SOLN
INTRAVENOUS | Status: AC
  Filled 2016-04-17: qty 2

## 2016-04-17 MED ORDER — FENTANYL CITRATE (PF) 100 MCG/2ML IJ SOLN
INTRAMUSCULAR | Status: AC
  Filled 2016-04-17: qty 2

## 2016-04-17 MED ORDER — THROMBIN 20000 UNITS EX SOLR
CUTANEOUS | Status: DC | PRN
  Administered 2016-04-17: 17:00:00 via TOPICAL

## 2016-04-17 MED ORDER — LIDOCAINE 2% (20 MG/ML) 5 ML SYRINGE
INTRAMUSCULAR | Status: AC
  Filled 2016-04-17: qty 15

## 2016-04-17 MED ORDER — ONDANSETRON HCL 4 MG/2ML IJ SOLN
4.0000 mg | INTRAMUSCULAR | Status: DC | PRN
Start: 2016-04-17 — End: 2016-04-20

## 2016-04-17 MED ORDER — SODIUM CHLORIDE 0.9% FLUSH: 3.0000 mL | INTRAVENOUS | Status: DC | PRN

## 2016-04-17 MED ORDER — FENTANYL CITRATE (PF) 100 MCG/2ML IJ SOLN: 25.0000 ug | INTRAMUSCULAR | Status: DC | PRN

## 2016-04-17 MED ORDER — ALUM & MAG HYDROXIDE-SIMETH 200-200-20 MG/5ML PO SUSP: 30.0000 mL | Freq: Four times a day (QID) | ORAL | Status: DC | PRN

## 2016-04-17 MED ORDER — SUGAMMADEX SODIUM 500 MG/5ML IV SOLN
INTRAVENOUS | Status: AC
  Filled 2016-04-17: qty 10

## 2016-04-17 MED ORDER — PANTOPRAZOLE SODIUM 40 MG PO TBEC
40.0000 mg | DELAYED_RELEASE_TABLET | Freq: Every day | ORAL | Status: DC
  Administered 2016-04-18 – 2016-04-20 (×3): 40 mg via ORAL
  Filled 2016-04-17 (×3): qty 1

## 2016-04-17 MED ORDER — VANCOMYCIN HCL 1000 MG IV SOLR
INTRAVENOUS | Status: DC | PRN
  Administered 2016-04-17: 1000 mg

## 2016-04-17 MED ORDER — TAMSULOSIN HCL 0.4 MG PO CAPS
0.4000 mg | ORAL_CAPSULE | Freq: Every evening | ORAL | Status: DC
  Administered 2016-04-17 – 2016-04-19 (×3): 0.4 mg via ORAL
  Filled 2016-04-17 (×3): qty 1

## 2016-04-17 MED ORDER — EPHEDRINE 5 MG/ML INJ
INTRAVENOUS | Status: AC
  Filled 2016-04-17: qty 20

## 2016-04-17 MED ORDER — VANCOMYCIN HCL 1000 MG IV SOLR
INTRAVENOUS | Status: AC
  Filled 2016-04-17: qty 1000

## 2016-04-17 MED ORDER — 0.9 % SODIUM CHLORIDE (POUR BTL) OPTIME
TOPICAL | Status: DC | PRN
  Administered 2016-04-17: 1000 mL

## 2016-04-17 MED ORDER — SUCCINYLCHOLINE CHLORIDE 200 MG/10ML IV SOSY
PREFILLED_SYRINGE | INTRAVENOUS | Status: AC
  Filled 2016-04-17: qty 20

## 2016-04-17 MED ORDER — CEFAZOLIN SODIUM-DEXTROSE 2-4 GM/100ML-% IV SOLN
INTRAVENOUS | Status: AC
  Filled 2016-04-17: qty 100

## 2016-04-17 MED ORDER — ALBUMIN HUMAN 5 % IV SOLN
INTRAVENOUS | Status: DC | PRN
  Administered 2016-04-17: 19:00:00 via INTRAVENOUS

## 2016-04-17 MED ORDER — LIDOCAINE-EPINEPHRINE 1 %-1:100000 IJ SOLN
INTRAMUSCULAR | Status: DC | PRN
Start: 2016-04-17 — End: 2016-04-17
  Administered 2016-04-17: 7 mL

## 2016-04-17 MED ORDER — SUGAMMADEX SODIUM 200 MG/2ML IV SOLN
INTRAVENOUS | Status: DC | PRN
  Administered 2016-04-17: 200 mg via INTRAVENOUS

## 2016-04-17 MED ORDER — PHENYLEPHRINE 40 MCG/ML (10ML) SYRINGE FOR IV PUSH (FOR BLOOD PRESSURE SUPPORT)
PREFILLED_SYRINGE | INTRAVENOUS | Status: AC
  Filled 2016-04-17: qty 30

## 2016-04-17 MED ORDER — FERROUS SULFATE 325 (65 FE) MG PO TABS
325.0000 mg | ORAL_TABLET | Freq: Every day | ORAL | Status: DC
  Administered 2016-04-18 – 2016-04-20 (×3): 325 mg via ORAL
  Filled 2016-04-17 (×3): qty 1

## 2016-04-17 MED ORDER — BUDESONIDE 0.25 MG/2ML IN SUSP
0.2500 mg | Freq: Two times a day (BID) | RESPIRATORY_TRACT | Status: DC
  Administered 2016-04-17 – 2016-04-19 (×5): 0.25 mg via RESPIRATORY_TRACT
  Filled 2016-04-17 (×4): qty 2

## 2016-04-17 MED ORDER — DEXAMETHASONE SODIUM PHOSPHATE 10 MG/ML IJ SOLN
INTRAMUSCULAR | Status: AC
  Filled 2016-04-17: qty 2

## 2016-04-17 MED ORDER — OXYCODONE-ACETAMINOPHEN 5-325 MG PO TABS
1.0000 | ORAL_TABLET | ORAL | Status: DC | PRN
Start: 2016-04-17 — End: 2016-04-20
  Administered 2016-04-18 – 2016-04-20 (×3): 1 via ORAL
  Filled 2016-04-17 (×3): qty 1

## 2016-04-17 MED ORDER — SAW PALMETTO (SERENOA REPENS) 160 MG PO CAPS: 160.0000 mg | ORAL_CAPSULE | Freq: Every day | ORAL | Status: DC

## 2016-04-17 MED ORDER — FUROSEMIDE 40 MG PO TABS
40.0000 mg | ORAL_TABLET | Freq: Every day | ORAL | Status: DC
  Administered 2016-04-18 – 2016-04-20 (×3): 40 mg via ORAL
  Filled 2016-04-17 (×3): qty 1

## 2016-04-17 MED ORDER — PHENYLEPHRINE 40 MCG/ML (10ML) SYRINGE FOR IV PUSH (FOR BLOOD PRESSURE SUPPORT)
PREFILLED_SYRINGE | INTRAVENOUS | Status: AC
  Filled 2016-04-17: qty 10

## 2016-04-17 MED ORDER — PHENOL 1.4 % MT LIQD: 1.0000 | OROMUCOSAL | Status: DC | PRN

## 2016-04-17 MED ORDER — PHENYLEPHRINE HCL 10 MG/ML IJ SOLN
INTRAMUSCULAR | Status: DC | PRN
  Administered 2016-04-17 (×3): 120 ug via INTRAVENOUS

## 2016-04-17 MED ORDER — SODIUM CHLORIDE 0.9 % IV SOLN
INTRAVENOUS | Status: DC
  Administered 2016-04-17 (×2): via INTRAVENOUS
  Administered 2016-04-18: 250 mL via INTRAVENOUS

## 2016-04-17 MED ORDER — PHENYLEPHRINE HCL 10 MG/ML IJ SOLN
INTRAVENOUS | Status: DC | PRN
  Administered 2016-04-17: 50 ug/min via INTRAVENOUS

## 2016-04-17 MED ORDER — ACETAMINOPHEN 650 MG RE SUPP: 650.0000 mg | RECTAL | Status: DC | PRN

## 2016-04-17 MED ORDER — SODIUM CHLORIDE 0.9% FLUSH
3.0000 mL | Freq: Two times a day (BID) | INTRAVENOUS | Status: DC
  Administered 2016-04-17 – 2016-04-19 (×3): 3 mL via INTRAVENOUS

## 2016-04-17 MED ORDER — AZITHROMYCIN 250 MG PO TABS
250.0000 mg | ORAL_TABLET | Freq: Every day | ORAL | Status: DC
  Administered 2016-04-18 – 2016-04-20 (×3): 250 mg via ORAL
  Filled 2016-04-17 (×3): qty 1

## 2016-04-17 MED ORDER — HYDROMORPHONE HCL 1 MG/ML IJ SOLN
0.5000 mg | INTRAMUSCULAR | Status: DC | PRN
Start: 2016-04-17 — End: 2016-04-20

## 2016-04-17 MED ORDER — ONDANSETRON HCL 4 MG/2ML IJ SOLN
INTRAMUSCULAR | Status: DC | PRN
  Administered 2016-04-17: 4 mg via INTRAVENOUS

## 2016-04-17 MED ORDER — SODIUM CHLORIDE 0.9 % IV SOLN: 250.0000 mL | INTRAVENOUS | Status: DC

## 2016-04-17 MED ORDER — BUPIVACAINE LIPOSOME 1.3 % IJ SUSP
20.0000 mL | Freq: Once | INTRAMUSCULAR | Status: DC
  Filled 2016-04-17: qty 20

## 2016-04-17 MED ORDER — MENTHOL 3 MG MT LOZG: 1.0000 | LOZENGE | OROMUCOSAL | Status: DC | PRN

## 2016-04-17 MED ORDER — DOCUSATE SODIUM 100 MG PO CAPS
100.0000 mg | ORAL_CAPSULE | Freq: Two times a day (BID) | ORAL | Status: DC
Start: 2016-04-17 — End: 2016-04-20
  Administered 2016-04-17 – 2016-04-20 (×5): 100 mg via ORAL
  Filled 2016-04-17 (×5): qty 1

## 2016-04-17 MED ORDER — FENTANYL CITRATE (PF) 100 MCG/2ML IJ SOLN
INTRAMUSCULAR | Status: DC | PRN
  Administered 2016-04-17: 25 ug via INTRAVENOUS
  Administered 2016-04-17: 50 ug via INTRAVENOUS
  Administered 2016-04-17: 100 ug via INTRAVENOUS
  Administered 2016-04-17: 25 ug via INTRAVENOUS
  Administered 2016-04-17 (×2): 50 ug via INTRAVENOUS

## 2016-04-17 MED ORDER — FINASTERIDE 5 MG PO TABS
5.0000 mg | ORAL_TABLET | Freq: Every day | ORAL | Status: DC
  Administered 2016-04-18 – 2016-04-20 (×3): 5 mg via ORAL
  Filled 2016-04-17 (×3): qty 1

## 2016-04-17 MED ORDER — ROCURONIUM BROMIDE 100 MG/10ML IV SOLN
INTRAVENOUS | Status: DC | PRN
  Administered 2016-04-17: 50 mg via INTRAVENOUS
  Administered 2016-04-17: 10 mg via INTRAVENOUS

## 2016-04-17 MED ORDER — ACETAMINOPHEN 325 MG PO TABS: 650.0000 mg | ORAL_TABLET | ORAL | Status: DC | PRN

## 2016-04-17 MED ORDER — THROMBIN 20000 UNITS EX SOLR
CUTANEOUS | Status: AC
  Filled 2016-04-17: qty 20000

## 2016-04-17 MED ORDER — BUPIVACAINE LIPOSOME 1.3 % IJ SUSP
INTRAMUSCULAR | Status: DC | PRN
  Administered 2016-04-17: 20 mL

## 2016-04-17 MED ORDER — CEFAZOLIN SODIUM-DEXTROSE 2-4 GM/100ML-% IV SOLN
2.0000 g | INTRAVENOUS | Status: AC
  Administered 2016-04-17: 2 g via INTRAVENOUS

## 2016-04-17 MED ORDER — PROPOFOL 10 MG/ML IV BOLUS
INTRAVENOUS | Status: DC | PRN
  Administered 2016-04-17: 90 mg via INTRAVENOUS

## 2016-04-17 MED ORDER — CALCIUM CARBONATE-VITAMIN D 500-200 MG-UNIT PO TABS
1.0000 | ORAL_TABLET | Freq: Every day | ORAL | Status: DC
  Administered 2016-04-18 – 2016-04-20 (×3): 1 via ORAL
  Filled 2016-04-17 (×3): qty 1

## 2016-04-17 MED ORDER — DEXAMETHASONE SODIUM PHOSPHATE 10 MG/ML IJ SOLN: 10.0000 mg | INTRAMUSCULAR | Status: DC

## 2016-04-17 SURGICAL SUPPLY — 68 items
BAG DECANTER FOR FLEXI CONT (MISCELLANEOUS) ×2 IMPLANT
BENZOIN TINCTURE PRP APPL 2/3 (GAUZE/BANDAGES/DRESSINGS) ×2 IMPLANT
BIT DRILL 5.0/4.0 (BIT) ×1 IMPLANT
BLADE CLIPPER SURG (BLADE) IMPLANT
BLADE SURG 11 STRL SS (BLADE) ×2 IMPLANT
BUR MATCHSTICK NEURO 3.0 LAGG (BURR) ×2 IMPLANT
BUR PRECISION FLUTE 6.0 (BURR) ×2 IMPLANT
CANISTER SUCT 3000ML PPV (MISCELLANEOUS) ×2 IMPLANT
CAP LOCKING (Cap) ×4 IMPLANT
CAP LOCKING 5.5 CREO (Cap) ×4 IMPLANT
CONT SPEC 4OZ CLIKSEAL STRL BL (MISCELLANEOUS) ×2 IMPLANT
COVER BACK TABLE 60X90IN (DRAPES) ×2 IMPLANT
DERMABOND ADVANCED (GAUZE/BANDAGES/DRESSINGS) ×1
DERMABOND ADVANCED .7 DNX12 (GAUZE/BANDAGES/DRESSINGS) ×1 IMPLANT
DRAPE C-ARM 42X72 X-RAY (DRAPES) ×2 IMPLANT
DRAPE C-ARMOR (DRAPES) ×2 IMPLANT
DRAPE HALF SHEET 40X57 (DRAPES) IMPLANT
DRAPE LAPAROTOMY 100X72X124 (DRAPES) ×2 IMPLANT
DRAPE POUCH INSTRU U-SHP 10X18 (DRAPES) ×2 IMPLANT
DRAPE SURG 17X23 STRL (DRAPES) ×2 IMPLANT
DRILL 5.0/4.0 (BIT) ×2
DRSG OPSITE 4X5.5 SM (GAUZE/BANDAGES/DRESSINGS) ×2 IMPLANT
DRSG OPSITE POSTOP 4X6 (GAUZE/BANDAGES/DRESSINGS) ×2 IMPLANT
DURAPREP 26ML APPLICATOR (WOUND CARE) ×2 IMPLANT
ELECT REM PT RETURN 9FT ADLT (ELECTROSURGICAL) ×2
ELECTRODE REM PT RTRN 9FT ADLT (ELECTROSURGICAL) ×1 IMPLANT
EVACUATOR 3/16  PVC DRAIN (DRAIN) ×1
EVACUATOR 3/16 PVC DRAIN (DRAIN) ×1 IMPLANT
GAUZE SPONGE 4X4 12PLY STRL (GAUZE/BANDAGES/DRESSINGS) ×2 IMPLANT
GAUZE SPONGE 4X4 16PLY XRAY LF (GAUZE/BANDAGES/DRESSINGS) IMPLANT
GLOVE BIO SURGEON STRL SZ7 (GLOVE) ×2 IMPLANT
GLOVE BIO SURGEON STRL SZ8 (GLOVE) ×4 IMPLANT
GLOVE BIOGEL PI IND STRL 7.5 (GLOVE) ×5 IMPLANT
GLOVE BIOGEL PI IND STRL 8.5 (GLOVE) ×1 IMPLANT
GLOVE BIOGEL PI INDICATOR 7.5 (GLOVE) ×5
GLOVE BIOGEL PI INDICATOR 8.5 (GLOVE) ×1
GLOVE ECLIPSE 7.5 STRL STRAW (GLOVE) ×2 IMPLANT
GLOVE INDICATOR 8.5 STRL (GLOVE) ×4 IMPLANT
GOWN STRL REUS W/ TWL LRG LVL3 (GOWN DISPOSABLE) ×3 IMPLANT
GOWN STRL REUS W/ TWL XL LVL3 (GOWN DISPOSABLE) ×4 IMPLANT
GOWN STRL REUS W/TWL 2XL LVL3 (GOWN DISPOSABLE) IMPLANT
GOWN STRL REUS W/TWL LRG LVL3 (GOWN DISPOSABLE) ×3
GOWN STRL REUS W/TWL XL LVL3 (GOWN DISPOSABLE) ×4
KIT BASIN OR (CUSTOM PROCEDURE TRAY) ×2 IMPLANT
KIT INFUSE SMALL (Orthopedic Implant) ×2 IMPLANT
KIT ROOM TURNOVER OR (KITS) ×2 IMPLANT
NEEDLE HYPO 25X1 1.5 SAFETY (NEEDLE) ×2 IMPLANT
NS IRRIG 1000ML POUR BTL (IV SOLUTION) ×2 IMPLANT
PACK LAMINECTOMY NEURO (CUSTOM PROCEDURE TRAY) ×2 IMPLANT
PAD ARMBOARD 7.5X6 YLW CONV (MISCELLANEOUS) ×6 IMPLANT
PUTTY BONE DBX 5CC MIX (Putty) ×2 IMPLANT
ROD SPINAL 35MM (Rod) ×4 IMPLANT
SCREW MOD 6.0-5.0X35MM (Screw) ×4 IMPLANT
SCREW PREASSEMBLY 6.0-5.0X35 (Screw) ×4 IMPLANT
SHEET MEDIUM DRAPE 40X70 STRL (DRAPES) ×2 IMPLANT
SPACER SUSTAIN O 10X26 9MM (Spacer) ×4 IMPLANT
SPONGE LAP 4X18 X RAY DECT (DISPOSABLE) IMPLANT
SPONGE SURGIFOAM ABS GEL 100 (HEMOSTASIS) ×2 IMPLANT
STRIP CLOSURE SKIN 1/2X4 (GAUZE/BANDAGES/DRESSINGS) ×4 IMPLANT
STRIP SURGICAL 1/2 X 6 IN (GAUZE/BANDAGES/DRESSINGS) ×2 IMPLANT
SUT VIC AB 0 CT1 18XCR BRD8 (SUTURE) ×1 IMPLANT
SUT VIC AB 0 CT1 8-18 (SUTURE) ×1
SUT VIC AB 2-0 CT1 18 (SUTURE) ×2 IMPLANT
SUT VIC AB 4-0 PS2 27 (SUTURE) ×2 IMPLANT
TOWEL OR 17X24 6PK STRL BLUE (TOWEL DISPOSABLE) ×2 IMPLANT
TOWEL OR 17X26 10 PK STRL BLUE (TOWEL DISPOSABLE) ×2 IMPLANT
TRAY FOLEY W/METER SILVER 16FR (SET/KITS/TRAYS/PACK) ×2 IMPLANT
WATER STERILE IRR 1000ML POUR (IV SOLUTION) ×2 IMPLANT

## 2016-04-17 NOTE — Op Note (Signed)
Preoperative diagnosis: Grade 1 spondylolisthesis severe lumbar spinal stenosis with neurogenic claudication L4-5  Postoperative diagnosis: Same  Procedure: #1 Gill decompressive lumbar laminectomy L4-5 and excess and requiring more work to would be needed with a standard interbody fusion with complete medial facetectomies radical foraminotomies of the L4 and L5 nerve roots.  #2 posterior lumbar interbody fusion L4-5 using globus peek cages packed with locally harvested autograft mixed with DBX mix and BMP.  #3 cortical screw fixation using the globus Creole cortical screw set modular with 35 mm screws 60/50 diameter   #4 posterior lateral arthrodesis and facet fusion L4-5 utilizing locally harvested autograft mixed with DBX mix and BMP.  Surgeon: Dominica Severin Hezakiah Champeau  Asst.: Mallie Mussel pool  Anesthesia: Gen.  EBL: Minimal  History of present illness: Patient is a 82 year old gentleman who has had long-standing back and bilateral leg pain to progress worsening last several weeks and months with progressive difficulty with ambulation and critical spinal stenosis. Workup revealed a grade 1 spondylosis ceases severe spinal stenosis with virtual complete blockage of CSF flow. Patient failed all forms conservative treatment and due to his imaging findings and progressive clinical syndrome I recommended decompression fusion at L4-5. I extensively went over the risks and benefits of the operation with the patient as well as perioperative course expectations of outcome and alternatives of surgery and he understands and agrees to proceed forward.  Operative procedure: Patient brought into the or was induced under general anesthesia positioned prone on the [facility] table his back was prepped and draped in routine sterile fashion progress to localize the appropriate level so after infiltration of 10 mL lidocaine with epi a midline incision was made and Bovie light cautery was used to calcification subperiosteal  dissections care lamina of L4 and L5. Interoperative x-ray confirmed the appropriate level so cortical screw placement was initiated first drilled a pilot hole at the 5 and 7:00 position in both pedicles although is very difficult to get orthogonal C-arm so only place 1 screw that I felt, with inadequate I could visualize I deferred the next cortical screw total after decompression. So then removed the spinous process at L4 and performed a central decompression was severe and marked hourglass compression of thecal sac and dense inflammatory response and a lot of hypertrophied ligament causing severe compression of thecal sac and marked facet arthropathy. Performed complete facetectomies and aggressively under bit the superior tickling process at L5 identified the L4 pedicle and drilled off the inferomedial aspect the L4 pedicle and I decompress the L4 root nerve root marching up the pars. After adequate decompression achieved at both L4 and L5 nerve roots I into the disc space using sequential distraction I worked up from a 7-10 elected to go with 9 mm height peek cages 26 no meters in length cleaned out the disc space bilaterally was a large free fragment on the patient's right side that I teased out from underneath the 4 root and removed all central disc prepared all the endplates inserted with one cage packed local autograft with BMP and DBX mix centrally and packed the contralateral cage. Then after both cages were inserted I placed the 03 cortical screws all screws excellent purchase posterior fluoroscopy confirmed good position of all the implants was a go see her good fixing space was maintained aggressive decortication was carried out over the residual articulating facet and lateral pars that remained and packed BMP and bone graft along the lateral pars and inferior aspect of L3-4 facet the superior articulating facet  of L5. Then inserted the rods anchored all the top tightening nuts explored all the  foramina to confirm patency no migration of graft material laid Gelfoam over the dura place a large Hemovac drain and closed the wound in layers after injecting X Burrell and the fascia and spring cleaning vancomycin powder in the wound. The wounds closed in layers with Vicryl running 4 septic a Dermabond benzo and Steri-Strips and sterile dressing was applied patient recovered in stable condition. At the case all needle counts sponge counts were correct.

## 2016-04-17 NOTE — Anesthesia Postprocedure Evaluation (Signed)
Anesthesia Post Note  Patient: Delane Ginger  Procedure(s) Performed: Procedure(s) (LRB): LUMBAR FOUR-FIVE POSTERIOR LUMBAR FUSION (N/A)  Patient location during evaluation: PACU Anesthesia Type: General Level of consciousness: awake and alert Pain management: pain level controlled Vital Signs Assessment: post-procedure vital signs reviewed and stable Respiratory status: spontaneous breathing, nonlabored ventilation, respiratory function stable and patient connected to nasal cannula oxygen Cardiovascular status: blood pressure returned to baseline and stable Postop Assessment: no signs of nausea or vomiting Anesthetic complications: no    Last Vitals:  Vitals:   04/17/16 1123 04/17/16 2040  BP: (!) 159/78 115/76  Pulse: 83 75  Resp: 16 12  Temp: 36.5 C 36.6 C    Last Pain:  Vitals:   04/17/16 1123  TempSrc: Oral                 Zenaida Deed

## 2016-04-17 NOTE — Progress Notes (Signed)
Pt arrived from PACU around 2200 no pain alert and oriented foley in place and hemo vac, honey comb dressing clean dry and intact will continue to monitor.

## 2016-04-17 NOTE — Anesthesia Procedure Notes (Signed)
Procedure Name: Intubation Date/Time: 04/17/2016 4:33 PM Performed by: Shirlyn Goltz Pre-anesthesia Checklist: Patient identified, Emergency Drugs available, Suction available and Patient being monitored Patient Re-evaluated:Patient Re-evaluated prior to inductionOxygen Delivery Method: Circle system utilized Intubation Type: IV induction Ventilation: Mask ventilation without difficulty Laryngoscope Size: Mac and 3 Grade View: Grade I Tube type: Oral Tube size: 7.0 mm Number of attempts: 1 Airway Equipment and Method: Stylet Placement Confirmation: ETT inserted through vocal cords under direct vision,  positive ETCO2 and breath sounds checked- equal and bilateral Secured at: 23 cm Tube secured with: Tape Dental Injury: Teeth and Oropharynx as per pre-operative assessment

## 2016-04-17 NOTE — Transfer of Care (Signed)
Immediate Anesthesia Transfer of Care Note  Patient: Mario Allen  Procedure(s) Performed: Procedure(s): LUMBAR FOUR-FIVE POSTERIOR LUMBAR FUSION (N/A)  Patient Location: PACU  Anesthesia Type:General  Level of Consciousness: awake, oriented, sedated, patient cooperative and responds to stimulation  Airway & Oxygen Therapy: Patient Spontanous Breathing and Patient connected to nasal cannula oxygen  Post-op Assessment: Report given to RN, Post -op Vital signs reviewed and stable, Patient moving all extremities and Patient moving all extremities X 4  Post vital signs: Reviewed and stable  Last Vitals:  Vitals:   04/17/16 1123  BP: (!) 159/78  Pulse: 83  Resp: 16  Temp: 36.5 C    Last Pain:  Vitals:   04/17/16 1123  TempSrc: Oral      Patients Stated Pain Goal: 5 (73/54/30 1484)  Complications: No apparent anesthesia complications

## 2016-04-17 NOTE — Addendum Note (Signed)
Addendum  created 04/17/16 2151 by Fidela Juneau, CRNA   Anesthesia Intra Meds edited

## 2016-04-17 NOTE — H&P (Signed)
Mario Allen is an 82 y.o. male.   Chief Complaint: Back pain leg pain and neurogenic location HPI: 82 year old with long-standing difficulty walking weakness in his legs workup revealing severe critical spinal stenosis L4-5 patient failed all forms of conservative treatment and due to his progressive conical syndrome severe neurogenic claudication stenosis of a conservative treatment I recommended decompressive laminectomy and fusion at L4-5. I extensively went over the risks and benefits of the operation with the patient as well as perioperative course expectations of outcome and alternatives of surgery and he understands and agrees to proceed forward.  Past Medical History:  Diagnosis Date  . Asthma   . Bronchiectasis ([place])   . Chronic back pain   . CKD (chronic kidney disease), stage III   . Dyspnea   . GERD (gastroesophageal reflux disease)   . Leg swelling   . Pleural effusion    R: 12/12/15    Past Surgical History:  Procedure Laterality Date  . APPENDECTOMY  1942    History reviewed. No pertinent family history. Social History:  reports that he has never smoked. He has never used smokeless tobacco. He reports that he drinks alcohol. He reports that he does not use drugs.  Allergies: No Known Allergies  Medications Prior to Admission  Medication Sig Dispense Refill  . albuterol (PROVENTIL HFA;VENTOLIN HFA) 108 (90 Base) MCG/ACT inhaler Inhale into the lungs every 6 (six) hours as needed for wheezing or shortness of breath.    Marland Kitchen azithromycin (ZITHROMAX) 250 MG tablet Take 250 mg by mouth daily.    . Calcium 500-125 MG-UNIT TABS Take 1 tablet by mouth daily.    . ferrous sulfate 325 (65 FE) MG tablet Take 325 mg by mouth daily with breakfast.    . finasteride (PROSCAR) 5 MG tablet Take 5 mg by mouth daily.    . fluticasone (FLOVENT HFA) 110 MCG/ACT inhaler Inhale 1 puff into the lungs 2 (two) times daily as needed (for exacerbation of breathing issues.).    Marland Kitchen furosemide  (LASIX) 20 MG tablet Take 20 mg by mouth every evening.    . furosemide (LASIX) 40 MG tablet Take 40 mg by mouth daily.    . metolazone (ZAROXOLYN) 2.5 MG tablet Take 2.5 mg by mouth every evening.    . mirtazapine (REMERON) 15 MG tablet Take 15 mg by mouth at bedtime.    . pantoprazole (PROTONIX) 40 MG tablet Take 40 mg by mouth daily.    . saw palmetto 160 MG capsule Take 160 mg by mouth daily.    . Soy Isoflavone 40 MG TABS Take 40 mg by mouth daily.    . tamsulosin (FLOMAX) 0.4 MG CAPS capsule Take 0.4 mg by mouth every evening.      Results for orders placed or performed during the hospital encounter of 04/17/16 (from the past 48 hour(s))  Basic metabolic panel     Status: Abnormal   Collection Time: 04/17/16 11:43 AM  Result Value Ref Range   Sodium 142 135 - 145 mmol/L   Potassium 3.8 3.5 - 5.1 mmol/L   Chloride 105 101 - 111 mmol/L   CO2 27 22 - 32 mmol/L   Glucose, Bld 100 (H) 65 - 99 mg/dL   BUN 67 (H) 6 - 20 mg/dL   Creatinine, Ser 2.25 (H) 0.61 - 1.24 mg/dL   Calcium 9.3 8.9 - 10.3 mg/dL   GFR calc non Af Amer 25 (L) >60 mL/min   GFR calc Af Amer 28 (L) >60 mL/min  Comment: (NOTE) The eGFR has been calculated using the CKD EPI equation. This calculation has not been validated in all clinical situations. eGFR's persistently <60 mL/min signify possible Chronic Kidney Disease.    Anion gap 10 5 - 15   No results found.  Review of Systems  Musculoskeletal: Positive for back pain, falls, joint pain and myalgias.  Neurological: Positive for tingling and sensory change.  All other systems reviewed and are negative.   Blood pressure (!) 159/78, pulse 83, temperature 97.7 F (36.5 C), temperature source Oral, resp. rate 16, height '5\' 10"'$  (1.778 m), weight 50.5 kg (111 lb 5 oz), SpO2 100 %. Physical Exam  Constitutional: He is oriented to person, place, and time. He appears well-developed and well-nourished.  HENT:  Head: Normocephalic.  Eyes: Pupils are equal,  round, and reactive to light.  Neck: Normal range of motion.  Respiratory: Effort normal.  GI: Soft. Bowel sounds are normal.  Neurological: He is alert and oriented to person, place, and time. He has normal strength. GCS eye subscore is 4. GCS verbal subscore is 5. GCS motor subscore is 6.  Strength is 5 out of 5 iliopsoas quads hamstrings gastrocs EHL     Assessment/Plan Decompressive laminectomy and fusion L4-5  Nasteho Glantz P, MD 04/17/2016, 3:39 PM

## 2016-04-17 NOTE — Anesthesia Preprocedure Evaluation (Addendum)
Anesthesia Evaluation  Patient identified by MRN, date of birth, ID band Patient awake    Reviewed: Allergy & Precautions, NPO status , Patient's Chart, lab work & pertinent test results  Airway Mallampati: I  TM Distance: >3 FB Neck ROM: Full    Dental  (+) Teeth Intact, Dental Advisory Given   Pulmonary asthma ,    breath sounds clear to auscultation       Cardiovascular negative cardio ROS   Rhythm:Regular Rate:Normal     Neuro/Psych negative neurological ROS  negative psych ROS   GI/Hepatic Neg liver ROS, GERD  Medicated,  Endo/Other  negative endocrine ROS  Renal/GU CRFRenal disease  negative genitourinary   Musculoskeletal negative musculoskeletal ROS (+)   Abdominal   Peds negative pediatric ROS (+)  Hematology negative hematology ROS (+)   Anesthesia Other Findings   Reproductive/Obstetrics negative OB ROS                            Lab Results  Component Value Date   WBC 11.9 (H) 04/14/2016   HGB 11.8 (L) 04/14/2016   HCT 36.6 (L) 04/14/2016   MCV 85.7 04/14/2016   PLT 208 04/14/2016   Lab Results  Component Value Date   CREATININE 2.25 (H) 04/17/2016   BUN 67 (H) 04/17/2016   NA 142 04/17/2016   K 3.8 04/17/2016   CL 105 04/17/2016   CO2 27 04/17/2016   No results found for: INR, PROTIME  Anesthesia Physical Anesthesia Plan  ASA: II  Anesthesia Plan: General   Post-op Pain Management:    Induction: Intravenous  Airway Management Planned: Oral ETT  Additional Equipment:   Intra-op Plan:   Post-operative Plan: Extubation in OR  Informed Consent: I have reviewed the patients History and Physical, chart, labs and discussed the procedure including the risks, benefits and alternatives for the proposed anesthesia with the patient or authorized representative who has indicated his/her understanding and acceptance.   Dental advisory given  Plan Discussed  with: CRNA  Anesthesia Plan Comments:         Anesthesia Quick Evaluation

## 2016-04-18 ENCOUNTER — Encounter (HOSPITAL_COMMUNITY): Payer: Self-pay | Admitting: General Practice

## 2016-04-18 LAB — BASIC METABOLIC PANEL
ANION GAP: 6 (ref 5–15)
BUN: 48 mg/dL — ABNORMAL HIGH (ref 6–20)
CALCIUM: 8.5 mg/dL — AB (ref 8.9–10.3)
CO2: 29 mmol/L (ref 22–32)
Chloride: 108 mmol/L (ref 101–111)
Creatinine, Ser: 1.69 mg/dL — ABNORMAL HIGH (ref 0.61–1.24)
GFR calc non Af Amer: 35 mL/min — ABNORMAL LOW (ref >60)
GFR, EST AFRICAN AMERICAN: 40 mL/min — AB (ref >60)
GLUCOSE: 97 mg/dL (ref 65–99)
POTASSIUM: 3.8 mmol/L (ref 3.5–5.1)
Sodium: 143 mmol/L (ref 135–145)

## 2016-04-18 NOTE — Evaluation (Addendum)
Physical Therapy Evaluation Patient Details Name: Mario Allen MRN: [id number] DOB: May 16, 1927 Today's Date: 04/18/2016   History of Present Illness  Pt s/p L4-5 fusion. PMH - asthma, ckd  Clinical Impression  Pt admitted with above diagnosis and presents to PT with functional limitations due to deficits listed below (See PT problem list). Pt needs skilled PT to maximize independence and safety to allow discharge to home with wife. Feel pt will need rollator initially at DC for stability of gait. Pt typically very independent. Pt's wife to have knee replacement in the near future so pt needs to return to independence prior to that.     Follow Up Recommendations Home health PT;Supervision - Intermittent    Equipment Recommendations  Other (comment) (rollator)    Recommendations for Other Services       Precautions / Restrictions Precautions Precautions: Back;Fall      Mobility  Bed Mobility               General bed mobility comments: Pt up in chair  Transfers Overall transfer level: Needs assistance Equipment used: None;4-wheeled walker Transfers: Sit to/from Stand Sit to Stand: Min guard         General transfer comment: Assist for safety  Ambulation/Gait Ambulation/Gait assistance: Min guard Ambulation Distance (Feet): 225 Feet Assistive device: 4-wheeled walker Gait Pattern/deviations: Step-through pattern;Decreased stride length;Trunk flexed;Drifts right/left   Gait velocity interpretation: at or above normal speed for age/gender General Gait Details: Assist for balance. Cues to stand more erect.  Stairs Stairs: Yes Stairs assistance: Supervision Stair Management: One rail Right;Step to pattern;Forwards Number of Stairs: 2    Wheelchair Mobility    Modified Rankin (Stroke Patients Only)       Balance Overall balance assessment: Needs assistance Sitting-balance support: No upper extremity supported;Feet supported Sitting balance-Leahy  Scale: Good     Standing balance support: No upper extremity supported Standing balance-Leahy Scale: Fair                               Pertinent Vitals/Pain Pain Assessment: 0-10 Pain Score: 4  Pain Location: incisional Pain Descriptors / Indicators: Operative site guarding Pain Intervention(s): Limited activity within patient's tolerance;Monitored during session    [place] expects to be discharged to:: Private residence Living Arrangements: Spouse/significant other Available Help at Discharge: Family;Available 24 hours/day Type of Home: House Home Access: Stairs to enter Entrance Stairs-Rails: None Entrance Stairs-Number of Steps: 1 or 5 Home Layout: Laundry or work area in [place]: [name] - single point      Prior Function Level of Independence: Independent with assistive device(s)         Comments: Used cane due to leg pain     Hand Dominance        Extremity/Trunk Assessment   Upper Extremity Assessment: Defer to OT evaluation           Lower Extremity Assessment: Generalized weakness         Communication   Communication: No difficulties  Cognition Arousal/Alertness: Awake/alert Behavior During Therapy: WFL for tasks assessed/performed Overall Cognitive Status: Within Functional Limits for tasks assessed                      General Comments      Exercises     Assessment/Plan    PT Assessment Patient needs continued PT services  PT Problem List Decreased strength;Decreased balance;Decreased mobility;Decreased knowledge of use  of DME;Decreased knowledge of precautions          PT Treatment Interventions DME instruction;Gait training;Functional mobility training;Therapeutic activities;Therapeutic exercise;Balance training;Patient/family education    PT Goals (Current goals can be found in the Care Plan section)  Acute Rehab PT Goals Patient Stated Goal: return home PT Goal  Formulation: With patient Time For Goal Achievement: 04/25/16 Potential to Achieve Goals: Good    Frequency Min 6X/week   Barriers to discharge        Co-evaluation               End of Session Equipment Utilized During Treatment: Back brace Activity Tolerance: Patient tolerated treatment well Patient left: in chair;with call bell/phone within reach;with chair alarm set Nurse Communication: Mobility status         Time: 7543-6067 PT Time Calculation (min) (ACUTE ONLY): 18 min   Charges:   PT Evaluation $PT Eval Moderate Complexity: 1 Procedure     PT G Codes:        Tajai Ihde 2016/04/27, 10:24 AM Suanne Marker PT [phone]

## 2016-04-18 NOTE — Progress Notes (Signed)
No acute events Patient feels unsteady when he walks Moving legs well Creatinine better than preop Stable Likely home tomorrow

## 2016-04-19 NOTE — Progress Notes (Signed)
OT Cancellation Note  Patient Details Name: Mario Allen MRN: [id number] DOB: [birth year]   Cancelled Treatment:    Reason Eval/Treat Not Completed: Other (comment). Pt just got his lunch and prefers I come back at a later time.  Almon Register [phone] 04/19/2016, 11:42 AM

## 2016-04-19 NOTE — Progress Notes (Signed)
Postop day 2 reports moderate back pain. No lower extremity pain. No complaints of numbness or weakness. Patient mobilizing reasonably well. Requests one more day in hospital secondary to social issues at home.  Afebrile. Vitals are stable. Awake and alert. Oriented and appropriate. Motor and sensory intact. Wound clean and dry. Drain output moderate.  Progressing well following lumbar fusion. Continue efforts at mobilization. Probable discharge home tomorrow.

## 2016-04-19 NOTE — Progress Notes (Signed)
Physical Therapy Treatment Patient Details Name: Mario Allen MRN: [id number] DOB: 18-Jul-1926 Today's Date: 04/19/2016    History of Present Illness Pt s/p L4-5 fusion. PMH - asthma, ckd    PT Comments    Pt progressing towards physical therapy goals. Awareness of safety is poor and pt appears confused at times, often repeating questions. Unsure what his baseline is. Will continue to follow and progress as able per POC.   Follow Up Recommendations  Home health PT;Supervision - Intermittent     Equipment Recommendations  Other (comment) (rollator)    Recommendations for Other Services       Precautions / Restrictions Precautions Precautions: Back;Fall    Mobility  Bed Mobility               General bed mobility comments: Pt up in chair upon PT arrival  Transfers Overall transfer level: Needs assistance Equipment used: 4-wheeled walker Transfers: Sit to/from Stand Sit to Stand: Min assist         General transfer comment: Pt was able to power-up to full standing position with close guard for safety, however first stand pt became usnteady and "plopped" back into the chair without warning. Pt able to maintain static standing with UE's on 4WW for support.   Ambulation/Gait Ambulation/Gait assistance: Min guard Ambulation Distance (Feet): 250 Feet Assistive device: 4-wheeled walker Gait Pattern/deviations: Step-through pattern;Decreased stride length;Trunk flexed   Gait velocity interpretation: at or above normal speed for age/gender General Gait Details: Assist for balance. Cues to stand more erect.   Stairs Stairs: Yes Stairs assistance: Supervision Stair Management: Two rails;Step to pattern;Forwards Number of Stairs: 5 General stair comments: VC's for general sequencing and safety.   Wheelchair Mobility    Modified Rankin (Stroke Patients Only)       Balance Overall balance assessment: Needs assistance Sitting-balance support: Feet  supported;No upper extremity supported Sitting balance-Leahy Scale: Good     Standing balance support: No upper extremity supported Standing balance-Leahy Scale: Poor                      Cognition Arousal/Alertness: Awake/alert Behavior During Therapy: WFL for tasks assessed/performed Overall Cognitive Status: Within Functional Limits for tasks assessed                      Exercises      General Comments        Pertinent Vitals/Pain Pain Assessment: 0-10 Pain Score: 4  Pain Location: Incision site Pain Descriptors / Indicators: Operative site guarding;Aching Pain Intervention(s): Limited activity within patient's tolerance;Monitored during session;Repositioned    Home Living                      Prior Function            PT Goals (current goals can now be found in the care plan section) Acute Rehab PT Goals Patient Stated Goal: return home PT Goal Formulation: With patient Time For Goal Achievement: 04/25/16 Potential to Achieve Goals: Good Progress towards PT goals: Progressing toward goals    Frequency    Min 6X/week      PT Plan Current plan remains appropriate    Co-evaluation             End of Session Equipment Utilized During Treatment: Back brace;Gait belt Activity Tolerance: Patient tolerated treatment well Patient left: in chair;with call bell/phone within reach;with chair alarm set     Time: 8921-1941 PT Time Calculation (  min) (ACUTE ONLY): 21 min  Charges:  $Gait Training: 8-22 mins                    G Codes:      Rolinda Roan May 19, 2016, 11:58 AM  Rolinda Roan, PT, DPT Acute Rehabilitation Services Pager: [phone]

## 2016-04-19 NOTE — Evaluation (Signed)
Occupational Therapy Evaluation Patient Details Name: Mario Allen MRN: [id number] DOB: 1926-07-30 Today's Date: 04/19/2016    History of Present Illness Pt s/p L4-5 fusion. PMH - asthma, ckd   Clinical Impression   This 82 yo male admitted and underwent above presents to acute OT with deficits below (see OT problem list) thus affecting his PLOF of Mod I to Independent with basic ADLs. He will benefit from acute OT with 24 hour S/prn A and HHOT follow up.    Follow Up Recommendations  Home health OT;Supervision/Assistance - 24 hour    Equipment Recommendations  3 in 1 bedside comode       Precautions / Restrictions Precautions Precautions: Back;Fall Restrictions Weight Bearing Restrictions: No      Mobility Bed Mobility               General bed mobility comments: Pt up in chair upon OT arrival  Transfers Overall transfer level: Needs assistance Equipment used: Rolling walker (2 wheeled) Transfers: Sit to/from Stand Sit to Stand: Min guard         General transfer comment: from recliner with VCs for safe hand placement for sit<>stand    Balance Overall balance assessment: Needs assistance Sitting-balance support: Feet supported;No upper extremity supported Sitting balance-Leahy Scale: Good     Standing balance support: Bilateral upper extremity supported Standing balance-Leahy Scale: Poor Standing balance comment: reliant on RW when up on his feet for safety                            ADL Overall ADL's : Needs assistance/impaired Eating/Feeding: Independent;Sitting   Grooming: Wash/dry hands;Min guard;Standing   Upper Body Bathing: Set up;Supervision/ safety;Sitting   Lower Body Bathing: Moderate assistance (min guard A sit<>stand)   Upper Body Dressing : Minimal assistance;Sitting   Lower Body Dressing: Maximal assistance (min guard A sit<>stand)   Toilet Transfer: Min guard;Ambulation;RW;BSC (over toilet)   Toileting-  Clothing Manipulation and Hygiene: Min guard;Sit to/from stand                         Pertinent Vitals/Pain Pain Assessment: No/denies pain ("I can just feel it")      Hand Dominance Right   Extremity/Trunk Assessment Upper Extremity Assessment Upper Extremity Assessment: Overall WFL for tasks assessed           Communication Communication Communication: No difficulties   Cognition Arousal/Alertness: Awake/alert Behavior During Therapy: WFL for tasks assessed/performed Overall Cognitive Status: Within Functional Limits for tasks assessed       Memory: Decreased recall of precautions (and decreased following precautions (says his wife knows what he is not suppose to do))                        Home Living Family/patient expects to be discharged to:: Private residence Living Arrangements: Spouse/significant other Available Help at Discharge: Family;Available 24 hours/day Type of Home: House Home Access: Stairs to enter [employer] of Steps: 1 or 5 Entrance Stairs-Rails: None Home Layout: Laundry or work area in basement     [employer] Shower/Tub: Walk-in shower;Curtain   Bathroom Toilet: Standard     Home Equipment: Cane - single point          Prior Functioning/Environment Level of Independence: Independent with assistive device(s)        Comments: Used cane due to leg pain  OT Problem List: Impaired balance (sitting and/or standing);Decreased safety awareness;Decreased knowledge of use of DME or AE;Decreased knowledge of precautions   OT Treatment/Interventions: Self-care/ADL training;Patient/family education;DME and/or AE instruction;Balance training    OT Goals(Current goals can be found in the care plan section) Acute Rehab OT Goals Patient Stated Goal: return home OT Goal Formulation: With patient Time For Goal Achievement: 04/26/16 Potential to Achieve Goals: Good  OT Frequency: Min 2X/week               End of Session Equipment Utilized During Treatment: Gait belt;Rolling walker;Back brace  Activity Tolerance: Patient tolerated treatment well Patient left: in chair;with call bell/phone within reach;with chair alarm set   Time: 1246-1300 OT Time Calculation (min): 14 min Charges:  OT General Charges $OT Visit: 1 Procedure OT Evaluation $OT Eval Moderate Complexity: 1 Procedure  Almon Register [phone] 04/19/2016, 1:09 PM

## 2016-04-20 MED ORDER — OXYCODONE-ACETAMINOPHEN 5-325 MG PO TABS: 1.0000 | ORAL_TABLET | ORAL | 0 refills | Status: DC | PRN

## 2016-04-20 NOTE — Discharge Instructions (Signed)
No lifting no bending no twisting no driving °

## 2016-04-20 NOTE — Progress Notes (Signed)
Occupational Therapy Treatment Patient Details Name: Mario Allen MRN: [id number] DOB: 20-Dec-1926 Today's Date: 04/20/2016    History of present illness Pt s/p L4-5 fusion. PMH - asthma, ckd   OT comments  Educated and practiced dressing, donning and doffing back brace, grooming, toilet and shower transfers. Educated pt and wife in multiple uses of 3 in 1 and reinforced back precautions during ADL. Pt has handout to reinforce education. Pt continues to be a high fall risk with posterior lean in standing during ADL. Educated wife on importance of closely guarding pt during standing activities. Pt and wife verbalizing understanding.   Follow Up Recommendations  Home health OT;Supervision/Assistance - 24 hour    Equipment Recommendations  3 in 1 bedside comode    Recommendations for Other Services      Precautions / Restrictions Precautions Precautions: Back;Fall Precaution Booklet Issued: Yes (comment) Precaution Comments: reviewed back precautions related to ADL Required Braces or Orthoses: Spinal Brace Spinal Brace: Lumbar corset Restrictions Weight Bearing Restrictions: No       Mobility Bed Mobility               General bed mobility comments: Pt up in chair upon OT arrival  Transfers Overall transfer level: Needs assistance Equipment used: Rolling walker (2 wheeled)   Sit to Stand: Supervision         General transfer comment: from recliner with VCs for safe hand placement for sit<>stand    Balance     Sitting balance-Leahy Scale: Good       Standing balance-Leahy Scale: Poor Standing balance comment: posterior bias, braces legs on chair                   ADL Overall ADL's : Needs assistance/impaired     Grooming: Min guard;Standing Grooming Details (indicate cue type and reason): educated in two cup method for oral care, may want to sit on rollator or 3 in 1 at sink to shave   Upper Body Bathing Details (indicate cue type and reason):  educated pt to avoid twisting while washing back, recommended long bath sponge Lower Body Bathing: Supervison/ safety;Sitting/lateral leans Lower Body Bathing Details (indicate cue type and reason): recommended sitting on 3 in 1 in shower due to poor balance and use of long bath sponge Upper Body Dressing : Min guard;Standing Upper Body Dressing Details (indicate cue type and reason): posterior lean, blocks LEs on chair, practiced back brace x 2 Lower Body Dressing: Min guard;Sit to/from stand Lower Body Dressing Details (indicate cue type and reason): no AE necessary, pt able to bring legs up Toilet Transfer: Min guard;Ambulation;RW (BSC over toilet)   Toileting- Clothing Manipulation and Hygiene: Min guard;Sit to/from stand Toileting - Clothing Manipulation Details (indicate cue type and reason): instructed to avoid twisting with pericare Tub/ Shower Transfer: Walk-in shower;Min guard;Ambulation;Rolling walker;3 in 1;Grab bars Tub/Shower Transfer Details (indicate cue type and reason): educated in use of 3 in 1 as shower seat, recommended wife supervise shower transfer Functional mobility during ADLs: Min guard;Rolling walker;Cueing for safety General ADL Comments: Educated in limiting sitting to 30 minutes and weight limit.      Vision                     Perception     Praxis      Cognition   Behavior During Therapy: [facility] for tasks assessed/performed Overall Cognitive Status: Within Functional Limits for tasks assessed  Extremity/Trunk Assessment               Exercises     Shoulder Instructions       General Comments      Pertinent Vitals/ Pain       Pain Assessment: No/denies pain Pain Intervention(s): Monitored during session  Home Living                                          Prior Functioning/Environment              Frequency  Min 2X/week        Progress Toward Goals  OT  Goals(current goals can now be found in the care plan section)  Progress towards OT goals: Progressing toward goals  Acute Rehab OT Goals Patient Stated Goal: return home Time For Goal Achievement: 04/26/16 Potential to Achieve Goals: Good  Plan Discharge plan remains appropriate    Co-evaluation                 End of Session Equipment Utilized During Treatment: Gait belt;Rolling walker;Back brace   Activity Tolerance Patient tolerated treatment well   Patient Left in chair;with call bell/phone within reach;with family/visitor present   Nurse Communication  (ready for d/c)        Time: 7737-3668 OT Time Calculation (min): 30 min  Charges: OT General Charges $OT Visit: 1 Procedure OT Treatments $Self Care/Home Management : 23-37 mins  Malka So 04/20/2016, 4:06 PM  [phone]

## 2016-04-20 NOTE — Care Management Note (Signed)
Case Management Note  Patient Details  Name: Mario Allen MRN: [id number] Date of Birth: 1926-12-01  Subjective/Objective:                    Action/Plan: Pt discharging home with orders for DME and [facility] services. CM met with the patient and provided him a list of [place] agencies in the [facility] area. He selected [facility]. CM called and informed them of the referral and faxed the information they requested. Pt also with orders for rollator and 3 in 1. Jermaine with [facility] DME informed and will deliver the equipment to the room. Bedside RN updated.   Expected Discharge Date:                  Expected Discharge Plan:  [place]  In-House Referral:     Discharge planning Services  CM Consult  Post Acute Care Choice:  Durable Medical Equipment, Home Health Choice offered to:  Patient  DME Arranged:  3-N-1, Walker rolling with seat DME Agency:  [place]:  PT, OT [facility] Agency:  Friend  Status of Service:  Completed, signed off  If discussed at [place] of Stay Meetings, dates discussed:    Additional Comments:  Pollie Friar, RN 04/20/2016, 1:11 PM

## 2016-04-20 NOTE — Clinical Social Work Note (Signed)
CSW consulted for New SNF. PT is recommending HHPT. RNCM is following for discharge planning needs. CSW is signing off as no further needs identified.   Darden Dates, MSW, LCSW  Clinical Social Worker  [phone]

## 2016-04-20 NOTE — Discharge Summary (Signed)
Physician Discharge Summary  Patient ID: Mario Allen MRN: [id number] DOB/AGE: 1926-08-16 82 y.o.  Admit date: 04/17/2016 Discharge date: 04/20/2016  Admission Diagnoses:Spinal stenosis grade 1 spondylolisthesis L4-5  Discharge Diagnoses: Same Active Problems:   Spondylolisthesis at L4-L5 level   Discharged Condition: good  Hospital Course: Patient is [facility] underwent decompressive laminectomy and fusion L4-5 postoperatively patient did very well recovered in the floor on the floor was angling and voiding spontaneously tolerating a Stable for discharge home with home health PT a rolling walker and commode seat.  Consults: Significant Diagnostic Studies: Treatments: L4-5 PLIF Discharge Exam: Blood pressure 96/60, pulse 99, temperature 98.4 F (36.9 C), temperature source Oral, resp. rate 20, height 5\' 10"  (1.778 m), weight 50.5 kg (111 lb 5 oz), SpO2 100 %. Strength out of 5 wound clean dry and intact  Disposition: Home  Discharge Instructions    DME Bedside commode    Complete by:  As directed    Face-to-face encounter (required for Medicare/Medicaid patients)    Complete by:  As directed    I Konnor Vondrasek P certify that this patient is under my care and that I, or a nurse practitioner or physician's assistant working with me, had a face-to-face encounter that meets the physician face-to-face encounter requirements with this patient on 04/20/2016. The encounter with the patient was in whole, or in part for the following medical condition(s) which is the primary reason for home health care (List medical condition): lumbar stenosis   The encounter with the patient was in whole, or in part, for the following medical condition, which is the primary reason for home health care:  lumbar stenosis   I certify that, based on my findings, the following services are medically necessary home health services:  Physical therapy   Reason for Medically Necessary Home Health Services:   Therapy- Instruction on use of Assistive Device for Ambulation on all Surfaces   My clinical findings support the need for the above services:  Pain interferes with ambulation/mobility   Further, I certify that my clinical findings support that this patient is homebound due to:  Pain interferes with ambulation/mobility   For home use only DME 4 wheeled rolling walker with seat    Complete by:  As directed    Home Health    Complete by:  As directed    To provide the following care/treatments:   PT OT         Medication List    TAKE these medications   albuterol 108 (90 Base) MCG/ACT inhaler Commonly known as:  PROVENTIL HFA;VENTOLIN HFA Inhale into the lungs every 6 (six) hours as needed for wheezing or shortness of breath.   azithromycin 250 MG tablet Commonly known as:  ZITHROMAX Take 250 mg by mouth daily.   Calcium 500-125 MG-UNIT Tabs Take 1 tablet by mouth daily.   ferrous sulfate 325 (65 FE) MG tablet Take 325 mg by mouth daily with breakfast.   finasteride 5 MG tablet Commonly known as:  PROSCAR Take 5 mg by mouth daily.   fluticasone 110 MCG/ACT inhaler Commonly known as:  FLOVENT HFA Inhale 1 puff into the lungs 2 (two) times daily as needed (for exacerbation of breathing issues.).   furosemide 20 MG tablet Commonly known as:  LASIX Take 20 mg by mouth every evening.   furosemide 40 MG tablet Commonly known as:  LASIX Take 40 mg by mouth daily.   metolazone 2.5 MG tablet Commonly known as:  ZAROXOLYN Take 2.5 mg by  mouth every evening.   mirtazapine 15 MG tablet Commonly known as:  REMERON Take 15 mg by mouth at bedtime.   oxyCODONE-acetaminophen 5-325 MG tablet Commonly known as:  PERCOCET/ROXICET Take 1-2 tablets by mouth every 4 (four) hours as needed for moderate pain.   pantoprazole 40 MG tablet Commonly known as:  PROTONIX Take 40 mg by mouth daily.   saw palmetto 160 MG capsule Take 160 mg by mouth daily.   Soy Isoflavone 40 MG  Tabs Take 40 mg by mouth daily.   tamsulosin 0.4 MG Caps capsule Commonly known as:  FLOMAX Take 0.4 mg by mouth every evening.      Follow-up Information    Atisha Hamidi P, MD Follow up in 2 day(s).   Specialty:  Neurosurgery Contact information: [address] [place] [address] [phone]        Elaina Hoops, MD .   Specialty:  Neurosurgery Contact information: [address] [place] [address] [phone]           Signed: Elaina Hoops 04/20/2016, 4:20 PM

## 2016-04-20 NOTE — Progress Notes (Signed)
Physical Therapy Treatment Patient Details Name: Mario Allen MRN: [id number] DOB: 03/03/1927 Today's Date: 04/20/2016    History of Present Illness Pt s/p L4-5 fusion. PMH - asthma, ckd    PT Comments    Pt admitted with above diagnosis. Pt currently with functional limitations due to balance and endurance deficits as well as safety issues. Pt was able to ambulate with rollator with min guard assist.  Education with wife complete and she states that she can assist pt at all times.  Wife can assist pt for safety.  Pt will benefit from skilled PT to increase their independence and safety with mobility to allow discharge to the venue listed below.    Follow Up Recommendations  Home health PT;Supervision - Intermittent     Equipment Recommendations  Other (comment) (rollator)    Recommendations for Other Services       Precautions / Restrictions Precautions Precautions: Back;Fall Precaution Booklet Issued: Yes (comment) Precaution Comments: reviewed back precautions related to mobility Required Braces or Orthoses: Spinal Brace Spinal Brace: Lumbar corset Restrictions Weight Bearing Restrictions: No    Mobility  Bed Mobility               General bed mobility comments: Pt up in chair upon PT arrival  Transfers Overall transfer level: Needs assistance Equipment used: 4-wheeled walker Transfers: Sit to/from Stand Sit to Stand: Supervision         General transfer comment: from recliner with VCs for safe hand placement for sit<>stand as well as verbal cues to ensure brakes on rollator locked.  Pt recalls precautions 60 % of time.  Ambulation/Gait Ambulation/Gait assistance: Min guard Ambulation Distance (Feet): 300 Feet Assistive device: 4-wheeled walker Gait Pattern/deviations: Step-through pattern;Decreased stride length;Trunk flexed;Drifts right/left   Gait velocity interpretation: at or above normal speed for age/gender General Gait Details: Assist for  balance. Cues to stand more erect.  Pt needed cues to lock rollator to sit and rest.  Also needed assist to stand from rollator and wife aware to guard rollator if pt sits down.  Cues needed to slow down at times.   Stairs   Stairs assistance: Supervision Stair Management: Forwards;Step to pattern;Two rails Number of Stairs: 1 General stair comments: Wife and husband verbalize how to get into house using door jams and wife to move rollator.   Wheelchair Mobility    Modified Rankin (Stroke Patients Only)       Balance Overall balance assessment: Needs assistance Sitting-balance support: No upper extremity supported;Feet supported Sitting balance-Leahy Scale: Good     Standing balance support: Bilateral upper extremity supported;During functional activity Standing balance-Leahy Scale: Poor Standing balance comment: posterior bias.  Needs RW for support.             High level balance activites: Direction changes;Turns;Sudden stops High Level Balance Comments: Needs min guard assist with rollator    Cognition Arousal/Alertness: Awake/alert Behavior During Therapy: WFL for tasks assessed/performed Overall Cognitive Status: Within Functional Limits for tasks assessed                      Exercises      General Comments General comments (skin integrity, edema, etc.): Verbal instuction regarding car transfer and thorough coverage of back handout.       Pertinent Vitals/Pain Pain Assessment: No/denies pain Pain Intervention(s): Monitored during session;Repositioned;Limited activity within patient's tolerance  VSS  Home Living  Prior Function            PT Goals (current goals can now be found in the care plan section) Acute Rehab PT Goals Patient Stated Goal: return home Progress towards PT goals: Progressing toward goals    Frequency    Min 6X/week      PT Plan Current plan remains appropriate    Co-evaluation              End of Session Equipment Utilized During Treatment: Back brace;Gait belt Activity Tolerance: Patient tolerated treatment well Patient left: in chair;with call bell/phone within reach;with chair alarm set;with family/visitor present     Time: 9233-0076 PT Time Calculation (min) (ACUTE ONLY): 30 min  Charges:  $Gait Training: 8-22 mins $Self Care/Home Management: 8-22                    G CodesDenice Allen 04/23/16, 5:26 PM Mario Allen,PT Acute Rehabilitation [phone] (pager)

## 2016-04-20 NOTE — Progress Notes (Signed)
Discharge instructions reviewed with patient/family. RX given at this time. Equipments at bedside. All questions answered at this time. Transport by family.  Ave Filter, RN

## 2016-04-29 DIAGNOSIS — J45909 Unspecified asthma, uncomplicated: Secondary | ICD-10-CM | POA: Diagnosis not present

## 2016-04-29 DIAGNOSIS — Z4789 Encounter for other orthopedic aftercare: Secondary | ICD-10-CM | POA: Diagnosis not present

## 2016-04-29 DIAGNOSIS — Z79891 Long term (current) use of opiate analgesic: Secondary | ICD-10-CM | POA: Diagnosis not present

## 2016-04-29 DIAGNOSIS — N183 Chronic kidney disease, stage 3 (moderate): Secondary | ICD-10-CM | POA: Diagnosis not present

## 2016-05-07 DIAGNOSIS — J90 Pleural effusion, not elsewhere classified: Secondary | ICD-10-CM | POA: Diagnosis not present

## 2016-05-07 DIAGNOSIS — J471 Bronchiectasis with (acute) exacerbation: Secondary | ICD-10-CM | POA: Diagnosis not present

## 2016-05-07 DIAGNOSIS — I481 Persistent atrial fibrillation: Secondary | ICD-10-CM | POA: Diagnosis not present

## 2016-05-20 DIAGNOSIS — Z79891 Long term (current) use of opiate analgesic: Secondary | ICD-10-CM | POA: Diagnosis not present

## 2016-05-20 DIAGNOSIS — N183 Chronic kidney disease, stage 3 (moderate): Secondary | ICD-10-CM | POA: Diagnosis not present

## 2016-05-20 DIAGNOSIS — Z4789 Encounter for other orthopedic aftercare: Secondary | ICD-10-CM | POA: Diagnosis not present

## 2016-05-20 DIAGNOSIS — J45909 Unspecified asthma, uncomplicated: Secondary | ICD-10-CM | POA: Diagnosis not present

## 2016-05-21 DIAGNOSIS — M5136 Other intervertebral disc degeneration, lumbar region: Secondary | ICD-10-CM | POA: Diagnosis not present

## 2016-05-21 DIAGNOSIS — M48062 Spinal stenosis, lumbar region with neurogenic claudication: Secondary | ICD-10-CM | POA: Diagnosis not present

## 2016-05-22 DIAGNOSIS — J45909 Unspecified asthma, uncomplicated: Secondary | ICD-10-CM | POA: Diagnosis not present

## 2016-05-22 DIAGNOSIS — Z4789 Encounter for other orthopedic aftercare: Secondary | ICD-10-CM | POA: Diagnosis not present

## 2016-05-22 DIAGNOSIS — Z79891 Long term (current) use of opiate analgesic: Secondary | ICD-10-CM | POA: Diagnosis not present

## 2016-05-22 DIAGNOSIS — N183 Chronic kidney disease, stage 3 (moderate): Secondary | ICD-10-CM | POA: Diagnosis not present

## 2016-05-27 DIAGNOSIS — Z79891 Long term (current) use of opiate analgesic: Secondary | ICD-10-CM | POA: Diagnosis not present

## 2016-05-27 DIAGNOSIS — N183 Chronic kidney disease, stage 3 (moderate): Secondary | ICD-10-CM | POA: Diagnosis not present

## 2016-05-27 DIAGNOSIS — J45909 Unspecified asthma, uncomplicated: Secondary | ICD-10-CM | POA: Diagnosis not present

## 2016-05-27 DIAGNOSIS — Z4789 Encounter for other orthopedic aftercare: Secondary | ICD-10-CM | POA: Diagnosis not present

## 2016-05-29 DIAGNOSIS — Z4789 Encounter for other orthopedic aftercare: Secondary | ICD-10-CM | POA: Diagnosis not present

## 2016-05-29 DIAGNOSIS — N183 Chronic kidney disease, stage 3 (moderate): Secondary | ICD-10-CM | POA: Diagnosis not present

## 2016-05-29 DIAGNOSIS — J45909 Unspecified asthma, uncomplicated: Secondary | ICD-10-CM | POA: Diagnosis not present

## 2016-05-29 DIAGNOSIS — Z79891 Long term (current) use of opiate analgesic: Secondary | ICD-10-CM | POA: Diagnosis not present

## 2016-06-07 DIAGNOSIS — Z23 Encounter for immunization: Secondary | ICD-10-CM | POA: Diagnosis not present

## 2016-06-19 DIAGNOSIS — N183 Chronic kidney disease, stage 3 (moderate): Secondary | ICD-10-CM | POA: Diagnosis not present

## 2016-06-22 HISTORY — PX: LUMBAR LAMINECTOMY: SHX95

## 2016-06-24 DIAGNOSIS — N179 Acute kidney failure, unspecified: Secondary | ICD-10-CM | POA: Diagnosis not present

## 2016-06-24 DIAGNOSIS — N183 Chronic kidney disease, stage 3 (moderate): Secondary | ICD-10-CM | POA: Diagnosis not present

## 2016-06-24 DIAGNOSIS — I129 Hypertensive chronic kidney disease with stage 1 through stage 4 chronic kidney disease, or unspecified chronic kidney disease: Secondary | ICD-10-CM | POA: Diagnosis not present

## 2016-07-02 DIAGNOSIS — M7138 Other bursal cyst, other site: Secondary | ICD-10-CM | POA: Diagnosis not present

## 2016-07-02 DIAGNOSIS — M48062 Spinal stenosis, lumbar region with neurogenic claudication: Secondary | ICD-10-CM | POA: Diagnosis not present

## 2016-07-02 DIAGNOSIS — M5137 Other intervertebral disc degeneration, lumbosacral region: Secondary | ICD-10-CM | POA: Diagnosis not present

## 2016-07-30 DIAGNOSIS — M6281 Muscle weakness (generalized): Secondary | ICD-10-CM | POA: Diagnosis not present

## 2016-07-30 DIAGNOSIS — R2689 Other abnormalities of gait and mobility: Secondary | ICD-10-CM | POA: Diagnosis not present

## 2016-07-30 DIAGNOSIS — M5137 Other intervertebral disc degeneration, lumbosacral region: Secondary | ICD-10-CM | POA: Diagnosis not present

## 2016-08-03 DIAGNOSIS — R2689 Other abnormalities of gait and mobility: Secondary | ICD-10-CM | POA: Diagnosis not present

## 2016-08-03 DIAGNOSIS — M6281 Muscle weakness (generalized): Secondary | ICD-10-CM | POA: Diagnosis not present

## 2016-08-03 DIAGNOSIS — M5137 Other intervertebral disc degeneration, lumbosacral region: Secondary | ICD-10-CM | POA: Diagnosis not present

## 2016-08-05 DIAGNOSIS — M6281 Muscle weakness (generalized): Secondary | ICD-10-CM | POA: Diagnosis not present

## 2016-08-05 DIAGNOSIS — M5137 Other intervertebral disc degeneration, lumbosacral region: Secondary | ICD-10-CM | POA: Diagnosis not present

## 2016-08-05 DIAGNOSIS — R2689 Other abnormalities of gait and mobility: Secondary | ICD-10-CM | POA: Diagnosis not present

## 2016-08-10 DIAGNOSIS — M6281 Muscle weakness (generalized): Secondary | ICD-10-CM | POA: Diagnosis not present

## 2016-08-10 DIAGNOSIS — R2689 Other abnormalities of gait and mobility: Secondary | ICD-10-CM | POA: Diagnosis not present

## 2016-08-10 DIAGNOSIS — M5137 Other intervertebral disc degeneration, lumbosacral region: Secondary | ICD-10-CM | POA: Diagnosis not present

## 2016-08-12 DIAGNOSIS — R2689 Other abnormalities of gait and mobility: Secondary | ICD-10-CM | POA: Diagnosis not present

## 2016-08-12 DIAGNOSIS — M6281 Muscle weakness (generalized): Secondary | ICD-10-CM | POA: Diagnosis not present

## 2016-08-12 DIAGNOSIS — M5137 Other intervertebral disc degeneration, lumbosacral region: Secondary | ICD-10-CM | POA: Diagnosis not present

## 2016-08-17 DIAGNOSIS — M6281 Muscle weakness (generalized): Secondary | ICD-10-CM | POA: Diagnosis not present

## 2016-08-17 DIAGNOSIS — M5137 Other intervertebral disc degeneration, lumbosacral region: Secondary | ICD-10-CM | POA: Diagnosis not present

## 2016-08-17 DIAGNOSIS — R2689 Other abnormalities of gait and mobility: Secondary | ICD-10-CM | POA: Diagnosis not present

## 2016-08-21 DIAGNOSIS — M5137 Other intervertebral disc degeneration, lumbosacral region: Secondary | ICD-10-CM | POA: Diagnosis not present

## 2016-08-24 DIAGNOSIS — M5137 Other intervertebral disc degeneration, lumbosacral region: Secondary | ICD-10-CM | POA: Diagnosis not present

## 2016-08-25 DIAGNOSIS — M48062 Spinal stenosis, lumbar region with neurogenic claudication: Secondary | ICD-10-CM | POA: Diagnosis not present

## 2016-08-25 DIAGNOSIS — N183 Chronic kidney disease, stage 3 (moderate): Secondary | ICD-10-CM | POA: Diagnosis not present

## 2016-08-26 DIAGNOSIS — M5137 Other intervertebral disc degeneration, lumbosacral region: Secondary | ICD-10-CM | POA: Diagnosis not present

## 2016-08-31 ENCOUNTER — Other Ambulatory Visit: Payer: Self-pay | Admitting: Student

## 2016-08-31 DIAGNOSIS — R269 Unspecified abnormalities of gait and mobility: Secondary | ICD-10-CM

## 2016-09-03 DIAGNOSIS — N183 Chronic kidney disease, stage 3 (moderate): Secondary | ICD-10-CM | POA: Diagnosis not present

## 2016-09-03 DIAGNOSIS — I129 Hypertensive chronic kidney disease with stage 1 through stage 4 chronic kidney disease, or unspecified chronic kidney disease: Secondary | ICD-10-CM | POA: Diagnosis not present

## 2016-09-07 ENCOUNTER — Ambulatory Visit
Admission: RE | Admit: 2016-09-07 | Discharge: 2016-09-07 | Disposition: A | Discharge status: Home / Self Care | Payer: Medicare Other | Source: Ambulatory Visit | Attending: Student | Admitting: Student

## 2016-09-07 DIAGNOSIS — R269 Unspecified abnormalities of gait and mobility: Secondary | ICD-10-CM

## 2016-09-07 DIAGNOSIS — M545 Low back pain: Secondary | ICD-10-CM | POA: Diagnosis not present

## 2016-09-07 DIAGNOSIS — M542 Cervicalgia: Secondary | ICD-10-CM | POA: Diagnosis not present

## 2016-09-07 DIAGNOSIS — M5137 Other intervertebral disc degeneration, lumbosacral region: Secondary | ICD-10-CM | POA: Diagnosis not present

## 2016-09-08 DIAGNOSIS — J471 Bronchiectasis with (acute) exacerbation: Secondary | ICD-10-CM | POA: Diagnosis not present

## 2016-09-08 DIAGNOSIS — J90 Pleural effusion, not elsewhere classified: Secondary | ICD-10-CM | POA: Diagnosis not present

## 2016-09-08 DIAGNOSIS — K219 Gastro-esophageal reflux disease without esophagitis: Secondary | ICD-10-CM | POA: Diagnosis not present

## 2016-09-28 DIAGNOSIS — R2689 Other abnormalities of gait and mobility: Secondary | ICD-10-CM | POA: Diagnosis not present

## 2016-09-28 DIAGNOSIS — M6281 Muscle weakness (generalized): Secondary | ICD-10-CM | POA: Diagnosis not present

## 2016-09-28 DIAGNOSIS — M5137 Other intervertebral disc degeneration, lumbosacral region: Secondary | ICD-10-CM | POA: Diagnosis not present

## 2016-10-01 DIAGNOSIS — R2689 Other abnormalities of gait and mobility: Secondary | ICD-10-CM | POA: Diagnosis not present

## 2016-10-01 DIAGNOSIS — M6281 Muscle weakness (generalized): Secondary | ICD-10-CM | POA: Diagnosis not present

## 2016-10-01 DIAGNOSIS — M5137 Other intervertebral disc degeneration, lumbosacral region: Secondary | ICD-10-CM | POA: Diagnosis not present

## 2016-10-06 DIAGNOSIS — M5137 Other intervertebral disc degeneration, lumbosacral region: Secondary | ICD-10-CM | POA: Diagnosis not present

## 2016-10-06 DIAGNOSIS — M6281 Muscle weakness (generalized): Secondary | ICD-10-CM | POA: Diagnosis not present

## 2016-10-06 DIAGNOSIS — R2689 Other abnormalities of gait and mobility: Secondary | ICD-10-CM | POA: Diagnosis not present

## 2016-10-08 DIAGNOSIS — M6281 Muscle weakness (generalized): Secondary | ICD-10-CM | POA: Diagnosis not present

## 2016-10-08 DIAGNOSIS — R2689 Other abnormalities of gait and mobility: Secondary | ICD-10-CM | POA: Diagnosis not present

## 2016-10-08 DIAGNOSIS — M5137 Other intervertebral disc degeneration, lumbosacral region: Secondary | ICD-10-CM | POA: Diagnosis not present

## 2016-10-12 DIAGNOSIS — I129 Hypertensive chronic kidney disease with stage 1 through stage 4 chronic kidney disease, or unspecified chronic kidney disease: Secondary | ICD-10-CM | POA: Diagnosis not present

## 2016-10-12 DIAGNOSIS — N183 Chronic kidney disease, stage 3 (moderate): Secondary | ICD-10-CM | POA: Diagnosis not present

## 2016-10-12 DIAGNOSIS — R600 Localized edema: Secondary | ICD-10-CM | POA: Diagnosis not present

## 2016-10-13 DIAGNOSIS — R2689 Other abnormalities of gait and mobility: Secondary | ICD-10-CM | POA: Diagnosis not present

## 2016-10-13 DIAGNOSIS — M6281 Muscle weakness (generalized): Secondary | ICD-10-CM | POA: Diagnosis not present

## 2016-10-13 DIAGNOSIS — M5137 Other intervertebral disc degeneration, lumbosacral region: Secondary | ICD-10-CM | POA: Diagnosis not present

## 2016-10-15 DIAGNOSIS — R2689 Other abnormalities of gait and mobility: Secondary | ICD-10-CM | POA: Diagnosis not present

## 2016-10-15 DIAGNOSIS — M5137 Other intervertebral disc degeneration, lumbosacral region: Secondary | ICD-10-CM | POA: Diagnosis not present

## 2016-10-15 DIAGNOSIS — M6281 Muscle weakness (generalized): Secondary | ICD-10-CM | POA: Diagnosis not present

## 2016-10-20 DIAGNOSIS — M6281 Muscle weakness (generalized): Secondary | ICD-10-CM | POA: Diagnosis not present

## 2016-10-20 DIAGNOSIS — R2689 Other abnormalities of gait and mobility: Secondary | ICD-10-CM | POA: Diagnosis not present

## 2016-10-20 DIAGNOSIS — M5137 Other intervertebral disc degeneration, lumbosacral region: Secondary | ICD-10-CM | POA: Diagnosis not present

## 2016-10-22 DIAGNOSIS — R2689 Other abnormalities of gait and mobility: Secondary | ICD-10-CM | POA: Diagnosis not present

## 2016-10-22 DIAGNOSIS — M5137 Other intervertebral disc degeneration, lumbosacral region: Secondary | ICD-10-CM | POA: Diagnosis not present

## 2016-10-22 DIAGNOSIS — M6281 Muscle weakness (generalized): Secondary | ICD-10-CM | POA: Diagnosis not present

## 2016-11-05 DIAGNOSIS — N183 Chronic kidney disease, stage 3 (moderate): Secondary | ICD-10-CM | POA: Diagnosis not present

## 2016-11-09 DIAGNOSIS — I129 Hypertensive chronic kidney disease with stage 1 through stage 4 chronic kidney disease, or unspecified chronic kidney disease: Secondary | ICD-10-CM | POA: Diagnosis not present

## 2016-11-09 DIAGNOSIS — N183 Chronic kidney disease, stage 3 (moderate): Secondary | ICD-10-CM | POA: Diagnosis not present

## 2016-11-17 DIAGNOSIS — H43393 Other vitreous opacities, bilateral: Secondary | ICD-10-CM | POA: Diagnosis not present

## 2016-11-17 DIAGNOSIS — H524 Presbyopia: Secondary | ICD-10-CM | POA: Diagnosis not present

## 2016-11-17 DIAGNOSIS — H5203 Hypermetropia, bilateral: Secondary | ICD-10-CM | POA: Diagnosis not present

## 2016-11-17 DIAGNOSIS — H25041 Posterior subcapsular polar age-related cataract, right eye: Secondary | ICD-10-CM | POA: Diagnosis not present

## 2016-11-17 DIAGNOSIS — H18413 Arcus senilis, bilateral: Secondary | ICD-10-CM | POA: Diagnosis not present

## 2016-11-17 DIAGNOSIS — H52203 Unspecified astigmatism, bilateral: Secondary | ICD-10-CM | POA: Diagnosis not present

## 2016-11-17 DIAGNOSIS — H2513 Age-related nuclear cataract, bilateral: Secondary | ICD-10-CM | POA: Diagnosis not present

## 2016-11-17 DIAGNOSIS — H25013 Cortical age-related cataract, bilateral: Secondary | ICD-10-CM | POA: Diagnosis not present

## 2016-11-24 DIAGNOSIS — M5137 Other intervertebral disc degeneration, lumbosacral region: Secondary | ICD-10-CM | POA: Diagnosis not present

## 2016-11-24 DIAGNOSIS — M7138 Other bursal cyst, other site: Secondary | ICD-10-CM | POA: Diagnosis not present

## 2017-01-05 DIAGNOSIS — M6281 Muscle weakness (generalized): Secondary | ICD-10-CM | POA: Diagnosis not present

## 2017-01-05 DIAGNOSIS — M5137 Other intervertebral disc degeneration, lumbosacral region: Secondary | ICD-10-CM | POA: Diagnosis not present

## 2017-01-05 DIAGNOSIS — R2689 Other abnormalities of gait and mobility: Secondary | ICD-10-CM | POA: Diagnosis not present

## 2017-01-06 DIAGNOSIS — N401 Enlarged prostate with lower urinary tract symptoms: Secondary | ICD-10-CM | POA: Diagnosis not present

## 2017-01-06 DIAGNOSIS — N138 Other obstructive and reflux uropathy: Secondary | ICD-10-CM | POA: Diagnosis not present

## 2017-01-07 DIAGNOSIS — M5137 Other intervertebral disc degeneration, lumbosacral region: Secondary | ICD-10-CM | POA: Diagnosis not present

## 2017-01-07 DIAGNOSIS — M6281 Muscle weakness (generalized): Secondary | ICD-10-CM | POA: Diagnosis not present

## 2017-01-07 DIAGNOSIS — R2689 Other abnormalities of gait and mobility: Secondary | ICD-10-CM | POA: Diagnosis not present

## 2017-01-11 DIAGNOSIS — M6281 Muscle weakness (generalized): Secondary | ICD-10-CM | POA: Diagnosis not present

## 2017-01-11 DIAGNOSIS — N138 Other obstructive and reflux uropathy: Secondary | ICD-10-CM | POA: Diagnosis not present

## 2017-01-11 DIAGNOSIS — M5137 Other intervertebral disc degeneration, lumbosacral region: Secondary | ICD-10-CM | POA: Diagnosis not present

## 2017-01-11 DIAGNOSIS — N401 Enlarged prostate with lower urinary tract symptoms: Secondary | ICD-10-CM | POA: Diagnosis not present

## 2017-01-11 DIAGNOSIS — R2689 Other abnormalities of gait and mobility: Secondary | ICD-10-CM | POA: Diagnosis not present

## 2017-01-12 DIAGNOSIS — H2513 Age-related nuclear cataract, bilateral: Secondary | ICD-10-CM | POA: Diagnosis not present

## 2017-01-12 DIAGNOSIS — H52203 Unspecified astigmatism, bilateral: Secondary | ICD-10-CM | POA: Diagnosis not present

## 2017-01-12 DIAGNOSIS — H524 Presbyopia: Secondary | ICD-10-CM | POA: Diagnosis not present

## 2017-01-12 DIAGNOSIS — H25013 Cortical age-related cataract, bilateral: Secondary | ICD-10-CM | POA: Diagnosis not present

## 2017-01-12 DIAGNOSIS — H25043 Posterior subcapsular polar age-related cataract, bilateral: Secondary | ICD-10-CM | POA: Diagnosis not present

## 2017-01-12 DIAGNOSIS — H5203 Hypermetropia, bilateral: Secondary | ICD-10-CM | POA: Diagnosis not present

## 2017-01-12 DIAGNOSIS — H43393 Other vitreous opacities, bilateral: Secondary | ICD-10-CM | POA: Diagnosis not present

## 2017-01-14 DIAGNOSIS — M5137 Other intervertebral disc degeneration, lumbosacral region: Secondary | ICD-10-CM | POA: Diagnosis not present

## 2017-01-14 DIAGNOSIS — M6281 Muscle weakness (generalized): Secondary | ICD-10-CM | POA: Diagnosis not present

## 2017-01-14 DIAGNOSIS — R2689 Other abnormalities of gait and mobility: Secondary | ICD-10-CM | POA: Diagnosis not present

## 2017-01-18 DIAGNOSIS — M5137 Other intervertebral disc degeneration, lumbosacral region: Secondary | ICD-10-CM | POA: Diagnosis not present

## 2017-01-18 DIAGNOSIS — R2689 Other abnormalities of gait and mobility: Secondary | ICD-10-CM | POA: Diagnosis not present

## 2017-01-18 DIAGNOSIS — M6281 Muscle weakness (generalized): Secondary | ICD-10-CM | POA: Diagnosis not present

## 2017-01-21 DIAGNOSIS — M6281 Muscle weakness (generalized): Secondary | ICD-10-CM | POA: Diagnosis not present

## 2017-01-21 DIAGNOSIS — M5137 Other intervertebral disc degeneration, lumbosacral region: Secondary | ICD-10-CM | POA: Diagnosis not present

## 2017-01-21 DIAGNOSIS — R2689 Other abnormalities of gait and mobility: Secondary | ICD-10-CM | POA: Diagnosis not present

## 2017-01-25 DIAGNOSIS — I10 Essential (primary) hypertension: Secondary | ICD-10-CM | POA: Diagnosis not present

## 2017-01-25 DIAGNOSIS — R269 Unspecified abnormalities of gait and mobility: Secondary | ICD-10-CM | POA: Diagnosis not present

## 2017-01-26 DIAGNOSIS — R2689 Other abnormalities of gait and mobility: Secondary | ICD-10-CM | POA: Diagnosis not present

## 2017-01-26 DIAGNOSIS — M5137 Other intervertebral disc degeneration, lumbosacral region: Secondary | ICD-10-CM | POA: Diagnosis not present

## 2017-01-26 DIAGNOSIS — M6281 Muscle weakness (generalized): Secondary | ICD-10-CM | POA: Diagnosis not present

## 2017-01-28 DIAGNOSIS — R2689 Other abnormalities of gait and mobility: Secondary | ICD-10-CM | POA: Diagnosis not present

## 2017-01-28 DIAGNOSIS — M5137 Other intervertebral disc degeneration, lumbosacral region: Secondary | ICD-10-CM | POA: Diagnosis not present

## 2017-01-28 DIAGNOSIS — M6281 Muscle weakness (generalized): Secondary | ICD-10-CM | POA: Diagnosis not present

## 2017-02-03 DIAGNOSIS — M5137 Other intervertebral disc degeneration, lumbosacral region: Secondary | ICD-10-CM | POA: Diagnosis not present

## 2017-02-03 DIAGNOSIS — R2689 Other abnormalities of gait and mobility: Secondary | ICD-10-CM | POA: Diagnosis not present

## 2017-02-03 DIAGNOSIS — M6281 Muscle weakness (generalized): Secondary | ICD-10-CM | POA: Diagnosis not present

## 2017-02-05 DIAGNOSIS — M5137 Other intervertebral disc degeneration, lumbosacral region: Secondary | ICD-10-CM | POA: Diagnosis not present

## 2017-02-05 DIAGNOSIS — R2689 Other abnormalities of gait and mobility: Secondary | ICD-10-CM | POA: Diagnosis not present

## 2017-02-05 DIAGNOSIS — M6281 Muscle weakness (generalized): Secondary | ICD-10-CM | POA: Diagnosis not present

## 2017-02-08 DIAGNOSIS — M5137 Other intervertebral disc degeneration, lumbosacral region: Secondary | ICD-10-CM | POA: Diagnosis not present

## 2017-02-08 DIAGNOSIS — R2689 Other abnormalities of gait and mobility: Secondary | ICD-10-CM | POA: Diagnosis not present

## 2017-02-08 DIAGNOSIS — M6281 Muscle weakness (generalized): Secondary | ICD-10-CM | POA: Diagnosis not present

## 2017-02-10 DIAGNOSIS — Z298 Encounter for other specified prophylactic measures: Secondary | ICD-10-CM | POA: Diagnosis not present

## 2017-02-15 DIAGNOSIS — M5137 Other intervertebral disc degeneration, lumbosacral region: Secondary | ICD-10-CM | POA: Diagnosis not present

## 2017-02-15 DIAGNOSIS — R2689 Other abnormalities of gait and mobility: Secondary | ICD-10-CM | POA: Diagnosis not present

## 2017-02-15 DIAGNOSIS — M6281 Muscle weakness (generalized): Secondary | ICD-10-CM | POA: Diagnosis not present

## 2017-02-18 DIAGNOSIS — M5137 Other intervertebral disc degeneration, lumbosacral region: Secondary | ICD-10-CM | POA: Diagnosis not present

## 2017-02-18 DIAGNOSIS — M6281 Muscle weakness (generalized): Secondary | ICD-10-CM | POA: Diagnosis not present

## 2017-02-18 DIAGNOSIS — R2689 Other abnormalities of gait and mobility: Secondary | ICD-10-CM | POA: Diagnosis not present

## 2017-02-23 DIAGNOSIS — H25011 Cortical age-related cataract, right eye: Secondary | ICD-10-CM | POA: Diagnosis not present

## 2017-02-23 DIAGNOSIS — H2511 Age-related nuclear cataract, right eye: Secondary | ICD-10-CM | POA: Diagnosis not present

## 2017-02-23 DIAGNOSIS — H269 Unspecified cataract: Secondary | ICD-10-CM | POA: Diagnosis not present

## 2017-02-23 DIAGNOSIS — H25041 Posterior subcapsular polar age-related cataract, right eye: Secondary | ICD-10-CM | POA: Diagnosis not present

## 2017-03-11 DIAGNOSIS — J471 Bronchiectasis with (acute) exacerbation: Secondary | ICD-10-CM | POA: Diagnosis not present

## 2017-03-11 DIAGNOSIS — J90 Pleural effusion, not elsewhere classified: Secondary | ICD-10-CM | POA: Diagnosis not present

## 2017-03-11 DIAGNOSIS — K219 Gastro-esophageal reflux disease without esophagitis: Secondary | ICD-10-CM | POA: Diagnosis not present

## 2017-04-01 DIAGNOSIS — H35351 Cystoid macular degeneration, right eye: Secondary | ICD-10-CM | POA: Diagnosis not present

## 2017-04-05 DIAGNOSIS — M4807 Spinal stenosis, lumbosacral region: Secondary | ICD-10-CM | POA: Diagnosis not present

## 2017-04-13 DIAGNOSIS — R2689 Other abnormalities of gait and mobility: Secondary | ICD-10-CM | POA: Diagnosis not present

## 2017-04-13 DIAGNOSIS — M5137 Other intervertebral disc degeneration, lumbosacral region: Secondary | ICD-10-CM | POA: Diagnosis not present

## 2017-04-13 DIAGNOSIS — M6281 Muscle weakness (generalized): Secondary | ICD-10-CM | POA: Diagnosis not present

## 2017-04-14 DIAGNOSIS — Z23 Encounter for immunization: Secondary | ICD-10-CM | POA: Diagnosis not present

## 2017-05-10 DIAGNOSIS — N183 Chronic kidney disease, stage 3 (moderate): Secondary | ICD-10-CM | POA: Diagnosis not present

## 2017-05-24 DIAGNOSIS — N184 Chronic kidney disease, stage 4 (severe): Secondary | ICD-10-CM | POA: Diagnosis not present

## 2017-05-24 DIAGNOSIS — I129 Hypertensive chronic kidney disease with stage 1 through stage 4 chronic kidney disease, or unspecified chronic kidney disease: Secondary | ICD-10-CM | POA: Diagnosis not present

## 2017-05-25 DIAGNOSIS — M48062 Spinal stenosis, lumbar region with neurogenic claudication: Secondary | ICD-10-CM | POA: Diagnosis not present

## 2017-06-21 DIAGNOSIS — R600 Localized edema: Secondary | ICD-10-CM | POA: Diagnosis not present

## 2017-06-21 DIAGNOSIS — E782 Mixed hyperlipidemia: Secondary | ICD-10-CM | POA: Diagnosis not present

## 2017-06-21 DIAGNOSIS — K219 Gastro-esophageal reflux disease without esophagitis: Secondary | ICD-10-CM | POA: Diagnosis not present

## 2017-06-21 DIAGNOSIS — M150 Primary generalized (osteo)arthritis: Secondary | ICD-10-CM | POA: Diagnosis not present

## 2017-06-21 DIAGNOSIS — N183 Chronic kidney disease, stage 3 (moderate): Secondary | ICD-10-CM | POA: Diagnosis not present

## 2017-06-21 DIAGNOSIS — R634 Abnormal weight loss: Secondary | ICD-10-CM | POA: Diagnosis not present

## 2017-06-21 DIAGNOSIS — N400 Enlarged prostate without lower urinary tract symptoms: Secondary | ICD-10-CM | POA: Diagnosis not present

## 2017-06-21 DIAGNOSIS — J471 Bronchiectasis with (acute) exacerbation: Secondary | ICD-10-CM | POA: Diagnosis not present

## 2017-06-21 DIAGNOSIS — I481 Persistent atrial fibrillation: Secondary | ICD-10-CM | POA: Diagnosis not present

## 2017-06-21 DIAGNOSIS — I129 Hypertensive chronic kidney disease with stage 1 through stage 4 chronic kidney disease, or unspecified chronic kidney disease: Secondary | ICD-10-CM | POA: Diagnosis not present

## 2017-06-30 DIAGNOSIS — H35351 Cystoid macular degeneration, right eye: Secondary | ICD-10-CM | POA: Diagnosis not present

## 2017-07-06 DIAGNOSIS — R269 Unspecified abnormalities of gait and mobility: Secondary | ICD-10-CM | POA: Diagnosis not present

## 2017-07-15 DIAGNOSIS — K2901 Acute gastritis with bleeding: Secondary | ICD-10-CM | POA: Diagnosis not present

## 2017-07-16 DIAGNOSIS — M48062 Spinal stenosis, lumbar region with neurogenic claudication: Secondary | ICD-10-CM | POA: Diagnosis not present

## 2017-07-16 DIAGNOSIS — R2689 Other abnormalities of gait and mobility: Secondary | ICD-10-CM | POA: Diagnosis not present

## 2017-07-20 DIAGNOSIS — R2689 Other abnormalities of gait and mobility: Secondary | ICD-10-CM | POA: Diagnosis not present

## 2017-07-20 DIAGNOSIS — M48062 Spinal stenosis, lumbar region with neurogenic claudication: Secondary | ICD-10-CM | POA: Diagnosis not present

## 2017-07-23 DIAGNOSIS — M48062 Spinal stenosis, lumbar region with neurogenic claudication: Secondary | ICD-10-CM | POA: Diagnosis not present

## 2017-07-23 DIAGNOSIS — R2689 Other abnormalities of gait and mobility: Secondary | ICD-10-CM | POA: Diagnosis not present

## 2017-07-28 DIAGNOSIS — M48062 Spinal stenosis, lumbar region with neurogenic claudication: Secondary | ICD-10-CM | POA: Diagnosis not present

## 2017-07-28 DIAGNOSIS — R2689 Other abnormalities of gait and mobility: Secondary | ICD-10-CM | POA: Diagnosis not present

## 2017-07-30 DIAGNOSIS — R2689 Other abnormalities of gait and mobility: Secondary | ICD-10-CM | POA: Diagnosis not present

## 2017-07-30 DIAGNOSIS — M48062 Spinal stenosis, lumbar region with neurogenic claudication: Secondary | ICD-10-CM | POA: Diagnosis not present

## 2017-08-02 DIAGNOSIS — M48062 Spinal stenosis, lumbar region with neurogenic claudication: Secondary | ICD-10-CM | POA: Diagnosis not present

## 2017-08-02 DIAGNOSIS — R2689 Other abnormalities of gait and mobility: Secondary | ICD-10-CM | POA: Diagnosis not present

## 2017-08-04 DIAGNOSIS — M48062 Spinal stenosis, lumbar region with neurogenic claudication: Secondary | ICD-10-CM | POA: Diagnosis not present

## 2017-08-04 DIAGNOSIS — R2689 Other abnormalities of gait and mobility: Secondary | ICD-10-CM | POA: Diagnosis not present

## 2017-08-05 DIAGNOSIS — Z961 Presence of intraocular lens: Secondary | ICD-10-CM | POA: Diagnosis not present

## 2017-08-05 DIAGNOSIS — H43811 Vitreous degeneration, right eye: Secondary | ICD-10-CM | POA: Diagnosis not present

## 2017-08-05 DIAGNOSIS — H35351 Cystoid macular degeneration, right eye: Secondary | ICD-10-CM | POA: Diagnosis not present

## 2017-08-05 DIAGNOSIS — H25812 Combined forms of age-related cataract, left eye: Secondary | ICD-10-CM | POA: Diagnosis not present

## 2017-08-09 DIAGNOSIS — M48062 Spinal stenosis, lumbar region with neurogenic claudication: Secondary | ICD-10-CM | POA: Diagnosis not present

## 2017-08-09 DIAGNOSIS — R2689 Other abnormalities of gait and mobility: Secondary | ICD-10-CM | POA: Diagnosis not present

## 2017-08-12 DIAGNOSIS — R2689 Other abnormalities of gait and mobility: Secondary | ICD-10-CM | POA: Diagnosis not present

## 2017-08-12 DIAGNOSIS — M48062 Spinal stenosis, lumbar region with neurogenic claudication: Secondary | ICD-10-CM | POA: Diagnosis not present

## 2017-08-19 DIAGNOSIS — N184 Chronic kidney disease, stage 4 (severe): Secondary | ICD-10-CM | POA: Diagnosis not present

## 2017-08-19 DIAGNOSIS — I129 Hypertensive chronic kidney disease with stage 1 through stage 4 chronic kidney disease, or unspecified chronic kidney disease: Secondary | ICD-10-CM | POA: Diagnosis not present

## 2017-09-02 DIAGNOSIS — H2512 Age-related nuclear cataract, left eye: Secondary | ICD-10-CM | POA: Diagnosis not present

## 2017-09-02 DIAGNOSIS — H52203 Unspecified astigmatism, bilateral: Secondary | ICD-10-CM | POA: Diagnosis not present

## 2017-09-02 DIAGNOSIS — H43811 Vitreous degeneration, right eye: Secondary | ICD-10-CM | POA: Diagnosis not present

## 2017-09-02 DIAGNOSIS — Z961 Presence of intraocular lens: Secondary | ICD-10-CM | POA: Diagnosis not present

## 2017-09-02 DIAGNOSIS — H524 Presbyopia: Secondary | ICD-10-CM | POA: Diagnosis not present

## 2017-09-02 DIAGNOSIS — H5203 Hypermetropia, bilateral: Secondary | ICD-10-CM | POA: Diagnosis not present

## 2017-09-02 DIAGNOSIS — H25042 Posterior subcapsular polar age-related cataract, left eye: Secondary | ICD-10-CM | POA: Diagnosis not present

## 2017-09-02 DIAGNOSIS — H35351 Cystoid macular degeneration, right eye: Secondary | ICD-10-CM | POA: Diagnosis not present

## 2017-09-02 DIAGNOSIS — H25012 Cortical age-related cataract, left eye: Secondary | ICD-10-CM | POA: Diagnosis not present

## 2017-09-07 DIAGNOSIS — M5440 Lumbago with sciatica, unspecified side: Secondary | ICD-10-CM | POA: Diagnosis not present

## 2017-09-07 DIAGNOSIS — R270 Ataxia, unspecified: Secondary | ICD-10-CM | POA: Diagnosis not present

## 2017-10-28 DIAGNOSIS — M5126 Other intervertebral disc displacement, lumbar region: Secondary | ICD-10-CM | POA: Diagnosis not present

## 2017-10-28 DIAGNOSIS — M5137 Other intervertebral disc degeneration, lumbosacral region: Secondary | ICD-10-CM | POA: Diagnosis not present

## 2017-10-28 DIAGNOSIS — R270 Ataxia, unspecified: Secondary | ICD-10-CM | POA: Diagnosis not present

## 2017-10-28 DIAGNOSIS — R269 Unspecified abnormalities of gait and mobility: Secondary | ICD-10-CM | POA: Diagnosis not present

## 2017-10-28 DIAGNOSIS — M5440 Lumbago with sciatica, unspecified side: Secondary | ICD-10-CM | POA: Diagnosis not present

## 2017-10-28 DIAGNOSIS — M542 Cervicalgia: Secondary | ICD-10-CM | POA: Diagnosis not present

## 2017-10-28 DIAGNOSIS — M48061 Spinal stenosis, lumbar region without neurogenic claudication: Secondary | ICD-10-CM | POA: Diagnosis not present

## 2017-11-04 DIAGNOSIS — I481 Persistent atrial fibrillation: Secondary | ICD-10-CM | POA: Diagnosis not present

## 2017-11-04 DIAGNOSIS — R627 Adult failure to thrive: Secondary | ICD-10-CM | POA: Diagnosis not present

## 2017-11-04 DIAGNOSIS — M545 Low back pain: Secondary | ICD-10-CM | POA: Diagnosis not present

## 2017-11-04 DIAGNOSIS — G8929 Other chronic pain: Secondary | ICD-10-CM | POA: Diagnosis not present

## 2017-11-04 DIAGNOSIS — J471 Bronchiectasis with (acute) exacerbation: Secondary | ICD-10-CM | POA: Diagnosis not present

## 2017-11-04 DIAGNOSIS — K219 Gastro-esophageal reflux disease without esophagitis: Secondary | ICD-10-CM | POA: Diagnosis not present

## 2017-11-04 DIAGNOSIS — E782 Mixed hyperlipidemia: Secondary | ICD-10-CM | POA: Diagnosis not present

## 2017-11-04 DIAGNOSIS — D649 Anemia, unspecified: Secondary | ICD-10-CM | POA: Diagnosis not present

## 2017-11-04 DIAGNOSIS — I10 Essential (primary) hypertension: Secondary | ICD-10-CM | POA: Diagnosis not present

## 2017-11-04 DIAGNOSIS — I129 Hypertensive chronic kidney disease with stage 1 through stage 4 chronic kidney disease, or unspecified chronic kidney disease: Secondary | ICD-10-CM | POA: Diagnosis not present

## 2017-11-04 DIAGNOSIS — N400 Enlarged prostate without lower urinary tract symptoms: Secondary | ICD-10-CM | POA: Diagnosis not present

## 2017-11-04 DIAGNOSIS — N183 Chronic kidney disease, stage 3 (moderate): Secondary | ICD-10-CM | POA: Diagnosis not present

## 2017-11-04 DIAGNOSIS — M150 Primary generalized (osteo)arthritis: Secondary | ICD-10-CM | POA: Diagnosis not present

## 2017-11-12 DIAGNOSIS — M1711 Unilateral primary osteoarthritis, right knee: Secondary | ICD-10-CM | POA: Diagnosis not present

## 2017-11-12 DIAGNOSIS — M7041 Prepatellar bursitis, right knee: Secondary | ICD-10-CM | POA: Diagnosis not present

## 2017-11-12 DIAGNOSIS — M7051 Other bursitis of knee, right knee: Secondary | ICD-10-CM | POA: Diagnosis not present

## 2017-11-12 DIAGNOSIS — N183 Chronic kidney disease, stage 3 (moderate): Secondary | ICD-10-CM | POA: Diagnosis not present

## 2017-11-22 DIAGNOSIS — R269 Unspecified abnormalities of gait and mobility: Secondary | ICD-10-CM | POA: Diagnosis not present

## 2017-11-22 DIAGNOSIS — M5441 Lumbago with sciatica, right side: Secondary | ICD-10-CM | POA: Diagnosis not present

## 2017-12-15 DIAGNOSIS — R269 Unspecified abnormalities of gait and mobility: Secondary | ICD-10-CM | POA: Diagnosis not present

## 2017-12-15 DIAGNOSIS — Z681 Body mass index (BMI) 19 or less, adult: Secondary | ICD-10-CM | POA: Diagnosis not present

## 2017-12-15 DIAGNOSIS — M5440 Lumbago with sciatica, unspecified side: Secondary | ICD-10-CM | POA: Diagnosis not present

## 2018-01-03 ENCOUNTER — Ambulatory Visit (INDEPENDENT_AMBULATORY_CARE_PROVIDER_SITE_OTHER): Payer: Medicare Other | Admitting: Neurology

## 2018-01-03 ENCOUNTER — Encounter: Payer: Self-pay | Admitting: Neurology

## 2018-01-03 VITALS — BP 159/89 | HR 84 | Ht 67.0 in | Wt 119.0 lb

## 2018-01-03 DIAGNOSIS — R293 Abnormal posture: Secondary | ICD-10-CM | POA: Diagnosis not present

## 2018-01-03 DIAGNOSIS — R292 Abnormal reflex: Secondary | ICD-10-CM

## 2018-01-03 DIAGNOSIS — R200 Anesthesia of skin: Secondary | ICD-10-CM | POA: Diagnosis not present

## 2018-01-03 DIAGNOSIS — R490 Dysphonia: Secondary | ICD-10-CM | POA: Diagnosis not present

## 2018-01-03 DIAGNOSIS — M62559 Muscle wasting and atrophy, not elsewhere classified, unspecified thigh: Secondary | ICD-10-CM

## 2018-01-03 NOTE — Patient Instructions (Signed)
Gait disorder, aquired. obtain a walker - rollator.   Fall Prevention in the Home Falls can cause injuries. They can happen to people of all ages. There are many things you can do to make your home safe and to help prevent falls. What can I do on the outside of my home?  Regularly fix the edges of walkways and driveways and fix any cracks.  Remove anything that might make you trip as you walk through a door, such as a raised step or threshold.  Trim any bushes or trees on the path to your home.  Use bright outdoor lighting.  Clear any walking paths of anything that might make someone trip, such as rocks or tools.  Regularly check to see if handrails are loose or broken. Make sure that both sides of any steps have handrails.  Any raised decks and porches should have guardrails on the edges.  Have any leaves, snow, or ice cleared regularly.  Use sand or salt on walking paths during winter.  Clean up any spills in your garage right away. This includes oil or grease spills. What can I do in the bathroom?  Use night lights.  Install grab bars by the toilet and in the tub and shower. Do not use towel bars as grab bars.  Use non-skid mats or decals in the tub or shower.  If you need to sit down in the shower, use a plastic, non-slip stool.  Keep the floor dry. Clean up any water that spills on the floor as soon as it happens.  Remove soap buildup in the tub or shower regularly.  Attach bath mats securely with double-sided non-slip rug tape.  Do not have throw rugs and other things on the floor that can make you trip. What can I do in the bedroom?  Use night lights.  Make sure that you have a light by your bed that is easy to reach.  Do not use any sheets or blankets that are too big for your bed. They should not hang down onto the floor.  Have a firm chair that has side arms. You can use this for support while you get dressed.  Do not have throw rugs and other things on  the floor that can make you trip. What can I do in the kitchen?  Clean up any spills right away.  Avoid walking on wet floors.  Keep items that you use a lot in easy-to-reach places.  If you need to reach something above you, use a strong step stool that has a grab bar.  Keep electrical cords out of the way.  Do not use floor polish or wax that makes floors slippery. If you must use wax, use non-skid floor wax.  Do not have throw rugs and other things on the floor that can make you trip. What can I do with my stairs?  Do not leave any items on the stairs.  Make sure that there are handrails on both sides of the stairs and use them. Fix handrails that are broken or loose. Make sure that handrails are as long as the stairways.  Check any carpeting to make sure that it is firmly attached to the stairs. Fix any carpet that is loose or worn.  Avoid having throw rugs at the top or bottom of the stairs. If you do have throw rugs, attach them to the floor with carpet tape.  Make sure that you have a light switch at the top of the  stairs and the bottom of the stairs. If you do not have them, ask someone to add them for you. What else can I do to help prevent falls?  Wear shoes that: ? Do not have high heels. ? Have rubber bottoms. ? Are comfortable and fit you well. ? Are closed at the toe. Do not wear sandals.  If you use a stepladder: ? Make sure that it is fully opened. Do not climb a closed stepladder. ? Make sure that both sides of the stepladder are locked into place. ? Ask someone to hold it for you, if possible.  Clearly mark and make sure that you can see: ? Any grab bars or handrails. ? First and last steps. ? Where the edge of each step is.  Use tools that help you move around (mobility aids) if they are needed. These include: ? Canes. ? Walkers. ? Scooters. ? Crutches.  Turn on the lights when you go into a dark area. Replace any light bulbs as soon as they burn  out.  Set up your furniture so you have a clear path. Avoid moving your furniture around.  If any of your floors are uneven, fix them.  If there are any pets around you, be aware of where they are.  Review your medicines with your doctor. Some medicines can make you feel dizzy. This can increase your chance of falling. Ask your doctor what other things that you can do to help prevent falls. This information is not intended to replace advice given to you by your health care provider. Make sure you discuss any questions you have with your health care provider. Document Released: 04/04/2009 Document Revised: 11/14/2015 Document Reviewed: 07/13/2014 Elsevier Interactive Patient Education  [employer].

## 2018-01-03 NOTE — Progress Notes (Signed)
Provider:  Larey Seat, M D    Referring Provider: Wallene Dales, MD Primary Care Physician:  Wallene Dales, MD  Chief Complaint  Patient presents with  . New Patient (Initial Visit)    pt with wife, rm 45. pt had back surgery 1.40yr ago he was walking fine then gradually the balance issues got worse. he has had PT completed. he has had several MRI's completed. when I brought the memory referral to his attention and he states he hasnt had any memory changes as well as wife verifying that. pt states that he was confused why he was getting the steroid in the back because in past he had gotten in the right groin area where he complains of pain.     HPI:  Mario Allen is a 84 y.o. male is seen here as a referral  from Dr. Joetta Manners for memory loss , which wife and patient deny.   I had the pleasure of meeting with Mario Allen, an 84 year old gentleman referred by [facility].  While his referral is for memory changes, the patient and his wife feel that these were just transient phenomena related to pain medication and steroid injection therapies.  Mario Allen has returned back to his usual baseline.  The patient has completed a regimen of physical therapy through the neurosurgical office, and he is using a cane this for prongs.  He reports that his balance has declined or decreased and his wife adds that this has been a lot of change.  Initially he reported that he was just fine prior to his back surgery,and his wife nodded in approval of this statement.  His back surgery was October 2017 - he was treated for spinal stenosis ( he thinks ). He was seen by Dr. Maryjean Ka ( the patient has reported  no pain) and a PA, but I lack all neurosurgical information- surgical site . Protocol.   I cannot offer any assistance. Please note that I do not practice any peripheral neurology.  Review of Systems: Out of a complete 14 system review, the patient complains of only the  following symptoms, and all other reviewed systems are negative. Gait disorder, falls.  DDD in lumbar spine. Question of spinal stenosis, he denies sciatica.    Social History   Socioeconomic History  . Marital status: Married    Spouse name: Not on file  . Number of children: Not on file  . Years of education: Not on file  . Highest education level: Not on file  Occupational History  . Not on file  Social Needs  . Financial resource strain: Not on file  . Food insecurity:    Worry: Not on file    Inability: Not on file  . Transportation needs:    Medical: Not on file    Non-medical: Not on file  Tobacco Use  . Smoking status: Never Smoker  . Smokeless tobacco: Never Used  Substance and Sexual Activity  . Alcohol use: Yes    Comment: occasionally  . Drug use: No  . Sexual activity: Not on file  Lifestyle  . Physical activity:    Days per week: Not on file    Minutes per session: Not on file  . Stress: Not on file  Relationships  . Social connections:    Talks on phone: Not on file    Gets together: Not on file    Attends religious service: Not on file    Active  member of club or organization: Not on file    Attends meetings of clubs or organizations: Not on file    Relationship status: Not on file  . Intimate partner violence:    Fear of current or ex partner: Not on file    Emotionally abused: Not on file    Physically abused: Not on file    Forced sexual activity: Not on file  Other Topics Concern  . Not on file  Social History Narrative  . Not on file    No family history on file.  Past Medical History:  Diagnosis Date  . Asthma   . Bronchiectasis ([place])   . Chronic back pain   . CKD (chronic kidney disease), stage III ([place])   . Dyspnea   . GERD (gastroesophageal reflux disease)   . Leg swelling   . Pleural effusion    R: 12/12/15    Past Surgical History:  Procedure Laterality Date  . APPENDECTOMY  1944  . LUMBAR LAMINECTOMY  2018     Current Outpatient Medications  Medication Sig Dispense Refill  . aspirin EC 81 MG tablet Take by mouth.    . Calcium 500-125 MG-UNIT TABS Take 1 tablet by mouth daily.    . ferrous sulfate 325 (65 FE) MG tablet Take 325 mg by mouth daily with breakfast.    . finasteride (PROSCAR) 5 MG tablet Take 5 mg by mouth daily.    . pantoprazole (PROTONIX) 40 MG tablet Take 40 mg by mouth daily.    . saw palmetto 160 MG capsule Take 160 mg by mouth daily.    . Soy Isoflavone 40 MG TABS Take 40 mg by mouth daily.     No current facility-administered medications for this visit.     Allergies as of 01/03/2018  . (No Known Allergies)    Vitals: BP (!) 159/89   Pulse 84   Ht 5\' 7"  (1.702 m)   Wt 119 lb (54 kg)   BMI 18.64 kg/m  Last Weight:  Wt Readings from Last 1 Encounters:  01/03/18 119 lb (54 kg)   Last Height:   Ht Readings from Last 1 Encounters:  01/03/18 5\' 7"  (1.702 m)    Physical exam:  General: The patient is awake, alert and appears not in acute distress. The patient is well groomed. Head: Normocephalic, atraumatic. Neck is rigid, stooped posture. . Mallampati 3 , neck circumference: 16'  Cardiovascular:  Regular rate and rhythm , without  murmurs or carotid bruit, and without distended neck veins. Respiratory: Lungs are clear to auscultation. Skin:  Without evidence of edema, or rash Trunk: BMI is normal and patient  has stooped  posture.  Neurologic exam : The patient is awake and alert, oriented to place and time.  Speech with low volume, dysphonia.  Memory subjective described as intact. There is a normal attention span & concentration ability. Speech is fluent without  dysarthriaor aphasia. Mood and affect are appropriate.  Cranial nerves: Pupils are equal and briskly reactive to light.  Cataract surgery on the right. Funduscopic exam without  evidence of pallor or edema. Extraocular movements  in vertical and horizontal planes intact and without nystagmus.   Visual fields by finger perimetry are intact. Hearing to finger rub impaired.   Facial sensation intact to fine touch. Facial motor strength is symmetric and tongue and uvula move midline. Tongue protrusion into either cheek is normal. Shoulder shrug is normal.   Motor exam:  Atrophy of muscle bulk in both  thighs.  Has symmetric strength in upper extremities. Sensory:  Fine touch, pinprick and vibration were tested in all extremities. He has loss of sensory in both feet. Proprioception was normal. Coordination: Rapid alternating movements in the fingers/hands were normal. Finger-to-nose maneuver  normal without evidence of ataxia, dysmetria or tremor. Gait and station: Patient walks with 4 prong assistive cane  / assistive device and is unable unassisted to climb up to the exam table.  Strength within limited . Stance is stooped. Very small steps, almost "tippling' turning with 7 steps. Tandem gait deferred.   Deep tendon reflexes: in the upper and lower extremities are brisk but  symmetric . Babinski maneuver response is downgoing.  Assessment:  After physical and neurologic examination, review of the limited provided pre-existing records, my assessment is that of :   Patient with thigh muscle atrophy, but minimal  hip flexion weakness. Brisk DTR. No significant pain.   Falls frequently with unsteadiness, balance problems. not dizzy !! Numbness in both feet.  Onset over 18-12 month , wife and patient feel this is related to surgery.   Denies memory loss- MOCA deferred, MMSE was 22-30.    Plan:   Treatment plan and additional workup :Walks better when using a shopping cart- I recommend using a rollator, seated walker.   May still have spinal stenosis still at L4-5 , and perhaps cervical , too.   Please evaluate for neuropathy- NCV and EMG , ANA , protein electrophoresis, Vit D. I will be happy to provide the electrophysiologic tests  through Dr Jannifer Franklin or Krista Blue.   I defer gait  stabilization to Neurosurgery PT group.     Mario Partridge Argie Lober MD 01/03/2018

## 2018-01-05 LAB — NEUROPATHY PANEL
A/G RATIO SPE: 0.9 (ref 0.7–1.7)
ALPHA 1: 0.3 g/dL (ref 0.0–0.4)
Albumin ELP: 3.2 g/dL (ref 2.9–4.4)
Alpha 2: 0.9 g/dL (ref 0.4–1.0)
Angio Convert Enzyme: 32 U/L (ref 14–82)
Anti Nuclear Antibody(ANA): NEGATIVE
Beta: 0.9 g/dL (ref 0.7–1.3)
GLOBULIN, TOTAL: 3.6 g/dL (ref 2.2–3.9)
Gamma Globulin: 1.4 g/dL (ref 0.4–1.8)
SED RATE: 29 mm/h (ref 0–30)
TOTAL PROTEIN: 6.8 g/dL (ref 6.0–8.5)
TSH: 1.64 u[IU]/mL (ref 0.450–4.500)
Vit D, 25-Hydroxy: 35.8 ng/mL (ref 30.0–100.0)
Vitamin B-12: 391 pg/mL (ref 232–1245)

## 2018-01-06 ENCOUNTER — Telehealth: Payer: Self-pay | Admitting: Neurology

## 2018-01-06 NOTE — Telephone Encounter (Signed)
Called the patient. No answer. LVM for the pt to call back.   Please inform the pt that the lab results were all normal and not indicating cause for his neuropathy.

## 2018-01-06 NOTE — Telephone Encounter (Signed)
-----   Message from Larey Seat, MD sent at 01/05/2018  5:50 PM EDT ----- No positive laboratory results to explain the  origin of neuropathy .

## 2018-01-10 NOTE — Telephone Encounter (Signed)
Pt called back and I was able to review with the patient his lab work. Pt is scheduled on 8/19 for EMG/NCV. Pt verbalized understanding. Pt had no questions at this time but was encouraged to call back if questions arise.

## 2018-01-13 DIAGNOSIS — M5126 Other intervertebral disc displacement, lumbar region: Secondary | ICD-10-CM | POA: Diagnosis not present

## 2018-02-07 ENCOUNTER — Encounter: Payer: Medicare Other | Admitting: Neurology

## 2018-02-08 DIAGNOSIS — N401 Enlarged prostate with lower urinary tract symptoms: Secondary | ICD-10-CM | POA: Diagnosis not present

## 2018-02-08 DIAGNOSIS — N3941 Urge incontinence: Secondary | ICD-10-CM | POA: Diagnosis not present

## 2018-02-11 DIAGNOSIS — M4316 Spondylolisthesis, lumbar region: Secondary | ICD-10-CM | POA: Diagnosis not present

## 2018-02-11 DIAGNOSIS — Z981 Arthrodesis status: Secondary | ICD-10-CM | POA: Diagnosis not present

## 2018-02-11 DIAGNOSIS — M4180 Other forms of scoliosis, site unspecified: Secondary | ICD-10-CM | POA: Diagnosis not present

## 2018-02-11 DIAGNOSIS — M5116 Intervertebral disc disorders with radiculopathy, lumbar region: Secondary | ICD-10-CM | POA: Diagnosis not present

## 2018-02-11 DIAGNOSIS — M545 Low back pain: Secondary | ICD-10-CM | POA: Diagnosis not present

## 2018-02-11 DIAGNOSIS — M160 Bilateral primary osteoarthritis of hip: Secondary | ICD-10-CM | POA: Diagnosis not present

## 2018-02-11 DIAGNOSIS — M4698 Unspecified inflammatory spondylopathy, sacral and sacrococcygeal region: Secondary | ICD-10-CM | POA: Diagnosis not present

## 2018-03-01 DIAGNOSIS — M48062 Spinal stenosis, lumbar region with neurogenic claudication: Secondary | ICD-10-CM | POA: Diagnosis not present

## 2018-03-15 DIAGNOSIS — N3941 Urge incontinence: Secondary | ICD-10-CM | POA: Diagnosis not present

## 2018-03-15 DIAGNOSIS — N401 Enlarged prostate with lower urinary tract symptoms: Secondary | ICD-10-CM | POA: Diagnosis not present

## 2018-03-16 DIAGNOSIS — E860 Dehydration: Secondary | ICD-10-CM | POA: Diagnosis not present

## 2018-03-16 DIAGNOSIS — R270 Ataxia, unspecified: Secondary | ICD-10-CM | POA: Diagnosis not present

## 2018-03-16 DIAGNOSIS — R748 Abnormal levels of other serum enzymes: Secondary | ICD-10-CM | POA: Diagnosis not present

## 2018-03-16 DIAGNOSIS — R531 Weakness: Secondary | ICD-10-CM | POA: Diagnosis not present

## 2018-03-16 DIAGNOSIS — J90 Pleural effusion, not elsewhere classified: Secondary | ICD-10-CM | POA: Diagnosis not present

## 2018-03-16 DIAGNOSIS — D649 Anemia, unspecified: Secondary | ICD-10-CM | POA: Diagnosis not present

## 2018-03-16 DIAGNOSIS — R220 Localized swelling, mass and lump, head: Secondary | ICD-10-CM | POA: Diagnosis not present

## 2018-03-16 DIAGNOSIS — M5489 Other dorsalgia: Secondary | ICD-10-CM | POA: Diagnosis not present

## 2018-03-16 DIAGNOSIS — Z79899 Other long term (current) drug therapy: Secondary | ICD-10-CM | POA: Diagnosis not present

## 2018-03-16 DIAGNOSIS — R404 Transient alteration of awareness: Secondary | ICD-10-CM | POA: Diagnosis not present

## 2018-03-16 DIAGNOSIS — I6782 Cerebral ischemia: Secondary | ICD-10-CM | POA: Diagnosis not present

## 2018-03-16 DIAGNOSIS — R52 Pain, unspecified: Secondary | ICD-10-CM | POA: Diagnosis not present

## 2018-03-16 DIAGNOSIS — G939 Disorder of brain, unspecified: Secondary | ICD-10-CM | POA: Diagnosis not present

## 2018-03-16 DIAGNOSIS — N179 Acute kidney failure, unspecified: Secondary | ICD-10-CM | POA: Diagnosis not present

## 2018-03-16 DIAGNOSIS — I10 Essential (primary) hypertension: Secondary | ICD-10-CM | POA: Diagnosis not present

## 2018-03-16 DIAGNOSIS — M25462 Effusion, left knee: Secondary | ICD-10-CM | POA: Diagnosis not present

## 2018-03-16 DIAGNOSIS — I160 Hypertensive urgency: Secondary | ICD-10-CM | POA: Diagnosis not present

## 2018-03-16 DIAGNOSIS — R262 Difficulty in walking, not elsewhere classified: Secondary | ICD-10-CM | POA: Diagnosis not present

## 2018-03-16 DIAGNOSIS — R778 Other specified abnormalities of plasma proteins: Secondary | ICD-10-CM | POA: Diagnosis not present

## 2018-03-16 DIAGNOSIS — K219 Gastro-esophageal reflux disease without esophagitis: Secondary | ICD-10-CM | POA: Diagnosis not present

## 2018-03-16 DIAGNOSIS — R0902 Hypoxemia: Secondary | ICD-10-CM | POA: Diagnosis not present

## 2018-03-17 DIAGNOSIS — N179 Acute kidney failure, unspecified: Secondary | ICD-10-CM | POA: Diagnosis not present

## 2018-03-17 DIAGNOSIS — R262 Difficulty in walking, not elsewhere classified: Secondary | ICD-10-CM | POA: Diagnosis not present

## 2018-03-17 DIAGNOSIS — R748 Abnormal levels of other serum enzymes: Secondary | ICD-10-CM | POA: Diagnosis not present

## 2018-03-22 ENCOUNTER — Ambulatory Visit (INDEPENDENT_AMBULATORY_CARE_PROVIDER_SITE_OTHER): Payer: Medicare Other | Admitting: Neurology

## 2018-03-22 ENCOUNTER — Encounter: Payer: Self-pay | Admitting: Neurology

## 2018-03-22 DIAGNOSIS — R200 Anesthesia of skin: Secondary | ICD-10-CM | POA: Diagnosis not present

## 2018-03-22 DIAGNOSIS — R293 Abnormal posture: Secondary | ICD-10-CM

## 2018-03-22 DIAGNOSIS — R292 Abnormal reflex: Secondary | ICD-10-CM

## 2018-03-22 DIAGNOSIS — R490 Dysphonia: Secondary | ICD-10-CM

## 2018-03-22 DIAGNOSIS — M62559 Muscle wasting and atrophy, not elsewhere classified, unspecified thigh: Secondary | ICD-10-CM

## 2018-03-22 NOTE — Procedures (Signed)
     HISTORY:   Mario Allen is an 84 year old gentleman with a history of a gait disorder, he has some weakness of both legs.  He has had prior lumbosacral spine surgery.  He is being evaluated for a possible neuropathy or a radiculopathy.  NERVE CONDUCTION STUDIES:  Nerve conduction studies performed on both lower extremities.  The distal motor latencies for the peroneal nerves were normal bilaterally with low motor amplitudes seen for these nerves bilaterally.  The distal motor latency for the right posterior tibial nerve was prolonged, normal on the left with low motor amplitudes seen for these nerves bilaterally.  The nerve conduction velocities for the peroneal nerves were normal bilaterally, slightly slowed for the right posterior tibial nerve and normal for the left posterior tibial nerve.  The sural sensory latencies were prolonged bilaterally, normal for the peroneal nerves bilaterally.  The F-wave latencies for the posterior tibial nerves were prolonged bilaterally.  EMG STUDIES:  EMG study was performed on the right lower extremity:  The tibialis anterior muscle reveals 1 to 2K motor units with decreased recruitment. 2+ fibrillations and positive waves were seen.  Polyphasic motor units were seen. The peroneus tertius muscle reveals no voluntary motor units with no recruitment. No fibrillations or positive waves were seen. The medial gastrocnemius muscle reveals 1 to 2K motor units with decreased recruitment. No fibrillations or positive waves were seen.  Polyphasic motor units were seen. The vastus lateralis muscle reveals 2 to 3K motor units with full recruitment. No fibrillations or positive waves were seen. The iliopsoas muscle reveals 2 to 4K motor units with full recruitment. No fibrillations or positive waves were seen. The biceps femoris muscle (long head) reveals 1 to 2K motor units with full recruitment. No fibrillations or positive waves were seen.  Polyphasic motor units  were seen. The lumbosacral paraspinal muscles were tested at 3 levels, and revealed no abnormalities of insertional activity at all 3 levels tested. There was fair relaxation.   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  EMG evaluation of the right lower extremity shows distal chronic and acute primarily neuropathic denervation distally consistent with a diagnosis of peripheral neuropathy.  No clear evidence of an overlying lumbosacral radiculopathy was seen.  Jill Alexanders MD 03/22/2018 1:41 PM  [facility] [address] [place], [place] [address]  Phone [phone] Fax [phone]

## 2018-03-22 NOTE — Progress Notes (Signed)
Please refer to EMG and nerve conduction procedure note.  

## 2018-03-23 NOTE — Progress Notes (Signed)
[place]    Nerve / Sites Muscle Latency Ref. Amplitude Ref. Rel Amp Segments Distance Velocity Ref. Area    ms ms mV mV %  cm m/s m/s mVms  R Peroneal - EDB     Ankle EDB 6.1 ?6.5 0.8 ?2.0 100 Ankle - EDB 9   2.6     Fib head EDB 12.7  0.7  85.3 Fib head - Ankle 29 44 ?44 3.2     Pop fossa EDB 15.4  0.7  99.3 Pop fossa - Fib head 12 44 ?44 2.9         Pop fossa - Ankle      L Peroneal - EDB     Ankle EDB 6.2 ?6.5 1.2 ?2.0 100 Ankle - EDB 9   3.8     Fib head EDB 12.6  1.2  105 Fib head - Ankle 29 45 ?44 4.8     Pop fossa EDB 14.9  1.2  97.4 Pop fossa - Fib head 10 44 ?44 4.7         Pop fossa - Ankle      R Tibial - AH     Ankle AH 6.2 ?5.8 1.6 ?4.0 100 Ankle - AH 9   2.3     Pop fossa AH 16.2  0.9  54.4 Pop fossa - Ankle 39 39 ?41 3.3  L Tibial - AH     Ankle AH 5.8 ?5.8 2.3 ?4.0 100 Ankle - AH 9   4.4     Pop fossa AH 15.3  2.0  84.6 Pop fossa - Ankle 39 41 ?41 3.8             SNC    Nerve / Sites Rec. Site Peak Lat Ref.  Amp Ref. Segments Distance    ms ms V V  cm  R Sural - Ankle (Calf)     Calf Ankle 4.5 ?4.4 3 ?6 Calf - Ankle 14  L Sural - Ankle (Calf)     Calf Ankle 4.9 ?4.4 3 ?6 Calf - Ankle 14  L Superficial peroneal - Ankle     Lat leg Ankle 4.4 ?4.4 2 ?6 Lat leg - Ankle 14  R Superficial peroneal - Ankle     Lat leg Ankle 4.3 ?4.4 3 ?6 Lat leg - Ankle 14              F  Wave    Nerve F Lat Ref.   ms ms  R Tibial - AH 58.5 ?56.0  L Tibial - AH 56.8 ?56.0

## 2018-03-24 ENCOUNTER — Telehealth: Payer: Self-pay | Admitting: *Deleted

## 2018-03-24 NOTE — Telephone Encounter (Signed)
Called, pt was not at home but would be back soon and the lady would have him call back. No details discussed. Left office number.

## 2018-03-24 NOTE — Telephone Encounter (Signed)
-----   Message from Larey Seat, MD sent at 03/23/2018  8:34 AM EDT ----- IMPRESSION: Nerve conduction studies done on both lower extremities shows  evidence of a peripheral neuropathy of moderate severity that is  primarily axonal in nature. EMG evaluation of the right lower  extremity shows distal chronic and acute primarily neuropathic  denervation distally consistent with a diagnosis of peripheral  Neuropathy.  No clear evidence of an overlying lumbosacral radiculopathy was seen.  Jill Alexanders MD 03/22/2018 1:41 PM   This study confirms the neuropathy diagnosis, and older denervation that  related to degenerative lumbar spine disease ( which has resulted in surgery).

## 2018-03-24 NOTE — Telephone Encounter (Signed)
Pt returned my call. I discussed results of the EMG from Dr. Brett Fairy that was consistent with peripheral neuropathy and showed older denervation that was related to his ddd in lumbar spine. He verbalized understanding but wants to know what does he do about he peripheral neuropathy. I informed pt that I would d/w MD and return his call. He verbalized appreciation.

## 2018-03-25 NOTE — Telephone Encounter (Signed)
There are medications that can help with pain in peripheral neuropathy-  Gabapentin, Lyrica. Im like to start very low, avoiding dizziness and sleepiness. Often I use them only at night.  Many patients report improvement with METANX, this is  a methylated form of Vit B 12.   Checking for diabetes, thyroid diease and/ or Vitamin deficiencies is important . Those tests are mainly run by [facility] before pateint's come to a specialists.   Larey Seat, MD

## 2018-03-28 NOTE — Telephone Encounter (Signed)
Spoke with patient and offered him two appt options this week to choose from so he can discuss peripheral neuropathy and possible treatment with Dr. Brett Fairy. Offered him appt this Wed 10/9 @ 11:00 or Thurs 10/10 @ 10:30. He said his wife had several appts this week and he would have to check his schedule and call us back. I advised him that I could not guarantee that the appts would stay open. He verbalized understanding and appreciation for the call.

## 2018-03-30 DIAGNOSIS — R05 Cough: Secondary | ICD-10-CM | POA: Diagnosis not present

## 2018-03-30 DIAGNOSIS — K219 Gastro-esophageal reflux disease without esophagitis: Secondary | ICD-10-CM | POA: Diagnosis not present

## 2018-03-30 DIAGNOSIS — I130 Hypertensive heart and chronic kidney disease with heart failure and stage 1 through stage 4 chronic kidney disease, or unspecified chronic kidney disease: Secondary | ICD-10-CM | POA: Diagnosis not present

## 2018-03-30 DIAGNOSIS — M48062 Spinal stenosis, lumbar region with neurogenic claudication: Secondary | ICD-10-CM | POA: Diagnosis not present

## 2018-03-30 DIAGNOSIS — N401 Enlarged prostate with lower urinary tract symptoms: Secondary | ICD-10-CM | POA: Diagnosis not present

## 2018-03-30 DIAGNOSIS — G609 Hereditary and idiopathic neuropathy, unspecified: Secondary | ICD-10-CM | POA: Diagnosis not present

## 2018-03-30 DIAGNOSIS — I509 Heart failure, unspecified: Secondary | ICD-10-CM | POA: Diagnosis not present

## 2018-03-30 DIAGNOSIS — D631 Anemia in chronic kidney disease: Secondary | ICD-10-CM | POA: Diagnosis not present

## 2018-03-30 DIAGNOSIS — D649 Anemia, unspecified: Secondary | ICD-10-CM | POA: Diagnosis not present

## 2018-03-30 DIAGNOSIS — E782 Mixed hyperlipidemia: Secondary | ICD-10-CM | POA: Diagnosis not present

## 2018-03-30 DIAGNOSIS — Z23 Encounter for immunization: Secondary | ICD-10-CM | POA: Diagnosis not present

## 2018-03-30 DIAGNOSIS — I129 Hypertensive chronic kidney disease with stage 1 through stage 4 chronic kidney disease, or unspecified chronic kidney disease: Secondary | ICD-10-CM | POA: Diagnosis not present

## 2018-03-30 DIAGNOSIS — J479 Bronchiectasis, uncomplicated: Secondary | ICD-10-CM | POA: Diagnosis not present

## 2018-03-30 DIAGNOSIS — D329 Benign neoplasm of meninges, unspecified: Secondary | ICD-10-CM | POA: Diagnosis not present

## 2018-03-30 DIAGNOSIS — N184 Chronic kidney disease, stage 4 (severe): Secondary | ICD-10-CM | POA: Diagnosis not present

## 2018-03-30 DIAGNOSIS — I872 Venous insufficiency (chronic) (peripheral): Secondary | ICD-10-CM | POA: Diagnosis not present

## 2018-03-30 DIAGNOSIS — T17308A Unspecified foreign body in larynx causing other injury, initial encounter: Secondary | ICD-10-CM | POA: Diagnosis not present

## 2018-04-08 DIAGNOSIS — R7989 Other specified abnormal findings of blood chemistry: Secondary | ICD-10-CM | POA: Diagnosis not present

## 2018-04-08 DIAGNOSIS — I509 Heart failure, unspecified: Secondary | ICD-10-CM | POA: Diagnosis not present

## 2018-04-26 DIAGNOSIS — M7062 Trochanteric bursitis, left hip: Secondary | ICD-10-CM | POA: Diagnosis not present

## 2018-04-26 DIAGNOSIS — M25552 Pain in left hip: Secondary | ICD-10-CM | POA: Diagnosis not present

## 2018-04-29 DIAGNOSIS — R0989 Other specified symptoms and signs involving the circulatory and respiratory systems: Secondary | ICD-10-CM | POA: Diagnosis not present

## 2018-04-29 DIAGNOSIS — J387 Other diseases of larynx: Secondary | ICD-10-CM | POA: Diagnosis not present

## 2018-04-29 DIAGNOSIS — R490 Dysphonia: Secondary | ICD-10-CM | POA: Diagnosis not present

## 2018-04-29 DIAGNOSIS — J342 Deviated nasal septum: Secondary | ICD-10-CM | POA: Diagnosis not present

## 2018-04-29 DIAGNOSIS — R6889 Other general symptoms and signs: Secondary | ICD-10-CM | POA: Diagnosis not present

## 2018-05-10 DIAGNOSIS — M25552 Pain in left hip: Secondary | ICD-10-CM | POA: Diagnosis not present

## 2018-05-10 DIAGNOSIS — S72112A Displaced fracture of greater trochanter of left femur, initial encounter for closed fracture: Secondary | ICD-10-CM | POA: Diagnosis not present

## 2018-05-11 DIAGNOSIS — N401 Enlarged prostate with lower urinary tract symptoms: Secondary | ICD-10-CM | POA: Diagnosis not present

## 2018-05-11 DIAGNOSIS — N184 Chronic kidney disease, stage 4 (severe): Secondary | ICD-10-CM | POA: Diagnosis not present

## 2018-05-11 DIAGNOSIS — D631 Anemia in chronic kidney disease: Secondary | ICD-10-CM | POA: Diagnosis not present

## 2018-05-11 DIAGNOSIS — D696 Thrombocytopenia, unspecified: Secondary | ICD-10-CM | POA: Diagnosis not present

## 2018-05-11 DIAGNOSIS — I130 Hypertensive heart and chronic kidney disease with heart failure and stage 1 through stage 4 chronic kidney disease, or unspecified chronic kidney disease: Secondary | ICD-10-CM | POA: Diagnosis not present

## 2018-05-11 DIAGNOSIS — I5032 Chronic diastolic (congestive) heart failure: Secondary | ICD-10-CM | POA: Diagnosis not present

## 2018-05-11 DIAGNOSIS — I700 Atherosclerosis of aorta: Secondary | ICD-10-CM | POA: Diagnosis not present

## 2018-05-11 DIAGNOSIS — D329 Benign neoplasm of meninges, unspecified: Secondary | ICD-10-CM | POA: Diagnosis not present

## 2018-05-11 DIAGNOSIS — N3943 Post-void dribbling: Secondary | ICD-10-CM | POA: Diagnosis not present

## 2018-05-11 DIAGNOSIS — J479 Bronchiectasis, uncomplicated: Secondary | ICD-10-CM | POA: Diagnosis not present

## 2018-05-16 DIAGNOSIS — N401 Enlarged prostate with lower urinary tract symptoms: Secondary | ICD-10-CM | POA: Diagnosis not present

## 2018-05-16 DIAGNOSIS — R32 Unspecified urinary incontinence: Secondary | ICD-10-CM | POA: Diagnosis not present

## 2018-06-30 DIAGNOSIS — K2901 Acute gastritis with bleeding: Secondary | ICD-10-CM | POA: Diagnosis not present

## 2018-07-12 DIAGNOSIS — I10 Essential (primary) hypertension: Secondary | ICD-10-CM | POA: Diagnosis not present

## 2018-07-12 DIAGNOSIS — Z681 Body mass index (BMI) 19 or less, adult: Secondary | ICD-10-CM | POA: Diagnosis not present

## 2018-07-12 DIAGNOSIS — M7062 Trochanteric bursitis, left hip: Secondary | ICD-10-CM | POA: Diagnosis not present

## 2018-07-18 DIAGNOSIS — G609 Hereditary and idiopathic neuropathy, unspecified: Secondary | ICD-10-CM | POA: Diagnosis not present

## 2018-07-18 DIAGNOSIS — I10 Essential (primary) hypertension: Secondary | ICD-10-CM | POA: Diagnosis not present

## 2018-07-18 DIAGNOSIS — R627 Adult failure to thrive: Secondary | ICD-10-CM | POA: Diagnosis not present

## 2018-07-18 DIAGNOSIS — E782 Mixed hyperlipidemia: Secondary | ICD-10-CM | POA: Diagnosis not present

## 2018-07-18 DIAGNOSIS — R5381 Other malaise: Secondary | ICD-10-CM | POA: Diagnosis not present

## 2018-07-18 DIAGNOSIS — N184 Chronic kidney disease, stage 4 (severe): Secondary | ICD-10-CM | POA: Diagnosis not present

## 2018-07-18 DIAGNOSIS — I130 Hypertensive heart and chronic kidney disease with heart failure and stage 1 through stage 4 chronic kidney disease, or unspecified chronic kidney disease: Secondary | ICD-10-CM | POA: Diagnosis not present

## 2018-07-18 DIAGNOSIS — J479 Bronchiectasis, uncomplicated: Secondary | ICD-10-CM | POA: Diagnosis not present

## 2018-07-18 DIAGNOSIS — N401 Enlarged prostate with lower urinary tract symptoms: Secondary | ICD-10-CM | POA: Diagnosis not present

## 2018-07-18 DIAGNOSIS — K219 Gastro-esophageal reflux disease without esophagitis: Secondary | ICD-10-CM | POA: Diagnosis not present

## 2018-07-18 DIAGNOSIS — N3943 Post-void dribbling: Secondary | ICD-10-CM | POA: Diagnosis not present

## 2018-07-18 DIAGNOSIS — I5032 Chronic diastolic (congestive) heart failure: Secondary | ICD-10-CM | POA: Diagnosis not present

## 2018-07-18 DIAGNOSIS — I700 Atherosclerosis of aorta: Secondary | ICD-10-CM | POA: Diagnosis not present

## 2018-08-12 IMAGING — RF DG LUMBAR SPINE 2-3V
1 series · 2 of 2 positions shown · non-contrast
Comparison: None.

CLINICAL DATA: L4-5 posterior lumbar disc fusion

EXAM:
LUMBAR SPINE - 2-3 VIEW; DG C-ARM GT 120 MIN

[Series 1: run · 2 of 2 slices shown]
[im 1/2]
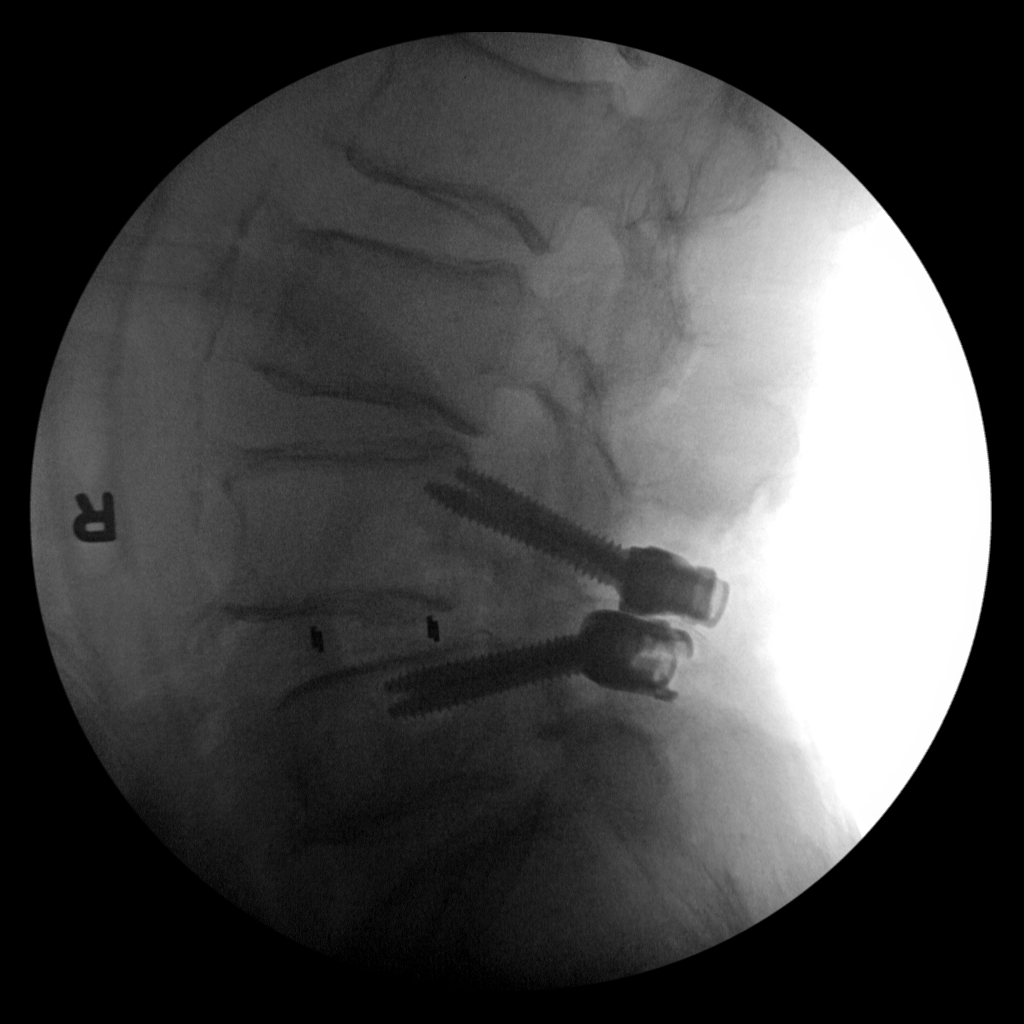
[im 2/2]
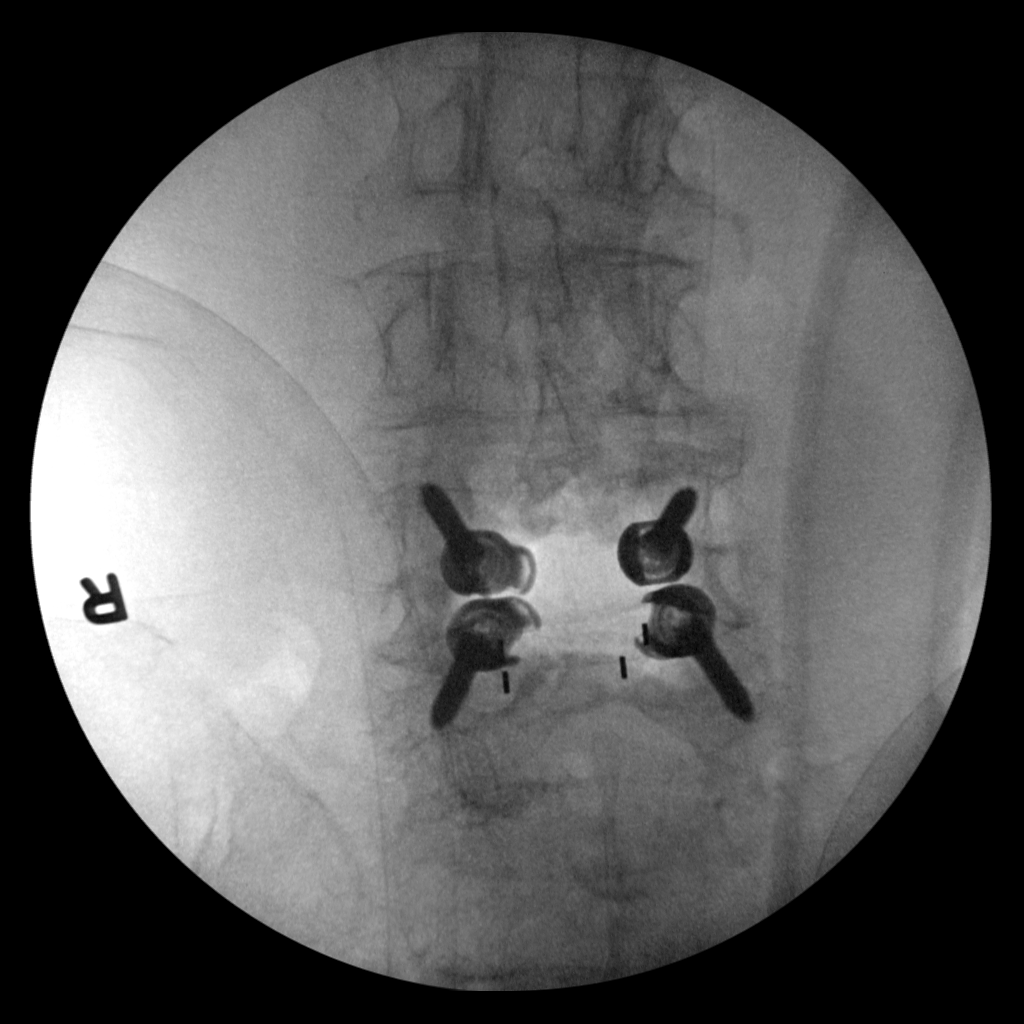

[2 of 2 positions shown; findings below may reference images not displayed]

FINDINGS: C-arm fluoroscopic views demonstrate bilateral pedicle screws
involving the L4 and L5 vertebral bodies and pedicles without L4-5
intervertebral disc in place. On the lateral view it appears at the
right L4 pedicle screw tip is only slightly noted within the
vertebral body.
IMPRESSION: IMPRESSION

PLIF across the L4-5 interspace. It appears that the right L4
pedicle screw is minimally within the posterior aspect of the L4
vertebral body.

## 2018-08-25 DIAGNOSIS — I10 Essential (primary) hypertension: Secondary | ICD-10-CM | POA: Diagnosis not present

## 2018-08-25 DIAGNOSIS — Z681 Body mass index (BMI) 19 or less, adult: Secondary | ICD-10-CM | POA: Diagnosis not present

## 2018-08-25 DIAGNOSIS — M48062 Spinal stenosis, lumbar region with neurogenic claudication: Secondary | ICD-10-CM | POA: Diagnosis not present

## 2018-10-04 DIAGNOSIS — S12491A Other nondisplaced fracture of fifth cervical vertebra, initial encounter for closed fracture: Secondary | ICD-10-CM | POA: Diagnosis not present

## 2018-10-04 DIAGNOSIS — R58 Hemorrhage, not elsewhere classified: Secondary | ICD-10-CM | POA: Diagnosis not present

## 2018-10-04 DIAGNOSIS — S0103XA Puncture wound without foreign body of scalp, initial encounter: Secondary | ICD-10-CM | POA: Diagnosis not present

## 2018-10-04 DIAGNOSIS — I10 Essential (primary) hypertension: Secondary | ICD-10-CM | POA: Diagnosis not present

## 2018-10-04 DIAGNOSIS — S0101XA Laceration without foreign body of scalp, initial encounter: Secondary | ICD-10-CM | POA: Diagnosis not present

## 2018-10-04 DIAGNOSIS — W19XXXA Unspecified fall, initial encounter: Secondary | ICD-10-CM | POA: Diagnosis not present

## 2018-10-04 DIAGNOSIS — S12400A Unspecified displaced fracture of fifth cervical vertebra, initial encounter for closed fracture: Secondary | ICD-10-CM | POA: Diagnosis not present

## 2018-10-04 DIAGNOSIS — S0990XA Unspecified injury of head, initial encounter: Secondary | ICD-10-CM | POA: Diagnosis not present

## 2018-11-24 DIAGNOSIS — M48062 Spinal stenosis, lumbar region with neurogenic claudication: Secondary | ICD-10-CM | POA: Diagnosis not present

## 2018-12-16 DIAGNOSIS — R509 Fever, unspecified: Secondary | ICD-10-CM | POA: Diagnosis not present

## 2018-12-16 DIAGNOSIS — R531 Weakness: Secondary | ICD-10-CM | POA: Diagnosis not present

## 2018-12-16 DIAGNOSIS — I160 Hypertensive urgency: Secondary | ICD-10-CM | POA: Diagnosis not present

## 2018-12-16 DIAGNOSIS — R918 Other nonspecific abnormal finding of lung field: Secondary | ICD-10-CM | POA: Diagnosis not present

## 2018-12-16 DIAGNOSIS — J159 Unspecified bacterial pneumonia: Secondary | ICD-10-CM | POA: Diagnosis not present

## 2018-12-17 DIAGNOSIS — I160 Hypertensive urgency: Secondary | ICD-10-CM | POA: Diagnosis present

## 2018-12-17 DIAGNOSIS — R0902 Hypoxemia: Secondary | ICD-10-CM | POA: Diagnosis not present

## 2018-12-17 DIAGNOSIS — R8290 Unspecified abnormal findings in urine: Secondary | ICD-10-CM | POA: Diagnosis not present

## 2018-12-17 DIAGNOSIS — R339 Retention of urine, unspecified: Secondary | ICD-10-CM | POA: Diagnosis not present

## 2018-12-17 DIAGNOSIS — I340 Nonrheumatic mitral (valve) insufficiency: Secondary | ICD-10-CM | POA: Diagnosis not present

## 2018-12-17 DIAGNOSIS — R509 Fever, unspecified: Secondary | ICD-10-CM | POA: Diagnosis not present

## 2018-12-17 DIAGNOSIS — N390 Urinary tract infection, site not specified: Secondary | ICD-10-CM | POA: Diagnosis not present

## 2018-12-17 DIAGNOSIS — R531 Weakness: Secondary | ICD-10-CM | POA: Diagnosis not present

## 2018-12-17 DIAGNOSIS — N138 Other obstructive and reflux uropathy: Secondary | ICD-10-CM | POA: Diagnosis not present

## 2018-12-17 DIAGNOSIS — R1312 Dysphagia, oropharyngeal phase: Secondary | ICD-10-CM | POA: Diagnosis present

## 2018-12-17 DIAGNOSIS — M2550 Pain in unspecified joint: Secondary | ICD-10-CM | POA: Diagnosis not present

## 2018-12-17 DIAGNOSIS — I130 Hypertensive heart and chronic kidney disease with heart failure and stage 1 through stage 4 chronic kidney disease, or unspecified chronic kidney disease: Secondary | ICD-10-CM | POA: Diagnosis present

## 2018-12-17 DIAGNOSIS — Z7401 Bed confinement status: Secondary | ICD-10-CM | POA: Diagnosis not present

## 2018-12-17 DIAGNOSIS — G9340 Encephalopathy, unspecified: Secondary | ICD-10-CM | POA: Diagnosis not present

## 2018-12-17 DIAGNOSIS — B9562 Methicillin resistant Staphylococcus aureus infection as the cause of diseases classified elsewhere: Secondary | ICD-10-CM | POA: Diagnosis not present

## 2018-12-17 DIAGNOSIS — M1990 Unspecified osteoarthritis, unspecified site: Secondary | ICD-10-CM | POA: Diagnosis present

## 2018-12-17 DIAGNOSIS — I351 Nonrheumatic aortic (valve) insufficiency: Secondary | ICD-10-CM | POA: Diagnosis not present

## 2018-12-17 DIAGNOSIS — R262 Difficulty in walking, not elsewhere classified: Secondary | ICD-10-CM | POA: Diagnosis present

## 2018-12-17 DIAGNOSIS — R918 Other nonspecific abnormal finding of lung field: Secondary | ICD-10-CM | POA: Diagnosis not present

## 2018-12-17 DIAGNOSIS — N401 Enlarged prostate with lower urinary tract symptoms: Secondary | ICD-10-CM | POA: Diagnosis present

## 2018-12-17 DIAGNOSIS — Z79899 Other long term (current) drug therapy: Secondary | ICD-10-CM | POA: Diagnosis not present

## 2018-12-17 DIAGNOSIS — D72829 Elevated white blood cell count, unspecified: Secondary | ICD-10-CM | POA: Diagnosis not present

## 2018-12-17 DIAGNOSIS — A419 Sepsis, unspecified organism: Secondary | ICD-10-CM | POA: Diagnosis not present

## 2018-12-17 DIAGNOSIS — I5032 Chronic diastolic (congestive) heart failure: Secondary | ICD-10-CM | POA: Diagnosis not present

## 2018-12-17 DIAGNOSIS — R4182 Altered mental status, unspecified: Secondary | ICD-10-CM | POA: Diagnosis not present

## 2018-12-17 DIAGNOSIS — R338 Other retention of urine: Secondary | ICD-10-CM | POA: Diagnosis present

## 2018-12-17 DIAGNOSIS — J690 Pneumonitis due to inhalation of food and vomit: Secondary | ICD-10-CM | POA: Diagnosis present

## 2018-12-17 DIAGNOSIS — R410 Disorientation, unspecified: Secondary | ICD-10-CM | POA: Diagnosis not present

## 2018-12-17 DIAGNOSIS — N183 Chronic kidney disease, stage 3 (moderate): Secondary | ICD-10-CM | POA: Diagnosis present

## 2018-12-17 DIAGNOSIS — J189 Pneumonia, unspecified organism: Secondary | ICD-10-CM | POA: Diagnosis not present

## 2018-12-17 DIAGNOSIS — N179 Acute kidney failure, unspecified: Secondary | ICD-10-CM | POA: Diagnosis not present

## 2018-12-17 DIAGNOSIS — I361 Nonrheumatic tricuspid (valve) insufficiency: Secondary | ICD-10-CM | POA: Diagnosis not present

## 2018-12-17 DIAGNOSIS — G9341 Metabolic encephalopathy: Secondary | ICD-10-CM | POA: Diagnosis not present

## 2018-12-17 DIAGNOSIS — J159 Unspecified bacterial pneumonia: Secondary | ICD-10-CM | POA: Diagnosis not present

## 2018-12-18 DIAGNOSIS — N179 Acute kidney failure, unspecified: Secondary | ICD-10-CM

## 2018-12-18 DIAGNOSIS — R8290 Unspecified abnormal findings in urine: Secondary | ICD-10-CM

## 2018-12-18 DIAGNOSIS — R339 Retention of urine, unspecified: Secondary | ICD-10-CM

## 2018-12-23 DIAGNOSIS — R0602 Shortness of breath: Secondary | ICD-10-CM | POA: Diagnosis not present

## 2018-12-23 DIAGNOSIS — Z7401 Bed confinement status: Secondary | ICD-10-CM | POA: Diagnosis not present

## 2018-12-23 DIAGNOSIS — N183 Chronic kidney disease, stage 3 (moderate): Secondary | ICD-10-CM | POA: Diagnosis present

## 2018-12-23 DIAGNOSIS — Z66 Do not resuscitate: Secondary | ICD-10-CM | POA: Diagnosis present

## 2018-12-23 DIAGNOSIS — R338 Other retention of urine: Secondary | ICD-10-CM | POA: Diagnosis not present

## 2018-12-23 DIAGNOSIS — B9562 Methicillin resistant Staphylococcus aureus infection as the cause of diseases classified elsewhere: Secondary | ICD-10-CM | POA: Diagnosis not present

## 2018-12-23 DIAGNOSIS — R339 Retention of urine, unspecified: Secondary | ICD-10-CM | POA: Diagnosis not present

## 2018-12-23 DIAGNOSIS — R351 Nocturia: Secondary | ICD-10-CM | POA: Diagnosis not present

## 2018-12-23 DIAGNOSIS — E872 Acidosis: Secondary | ICD-10-CM | POA: Diagnosis present

## 2018-12-23 DIAGNOSIS — R0989 Other specified symptoms and signs involving the circulatory and respiratory systems: Secondary | ICD-10-CM | POA: Diagnosis not present

## 2018-12-23 DIAGNOSIS — R069 Unspecified abnormalities of breathing: Secondary | ICD-10-CM | POA: Diagnosis not present

## 2018-12-23 DIAGNOSIS — Z992 Dependence on renal dialysis: Secondary | ICD-10-CM | POA: Diagnosis not present

## 2018-12-23 DIAGNOSIS — E870 Hyperosmolality and hypernatremia: Secondary | ICD-10-CM | POA: Diagnosis not present

## 2018-12-23 DIAGNOSIS — N3020 Other chronic cystitis without hematuria: Secondary | ICD-10-CM | POA: Diagnosis not present

## 2018-12-23 DIAGNOSIS — R2689 Other abnormalities of gait and mobility: Secondary | ICD-10-CM | POA: Diagnosis present

## 2018-12-23 DIAGNOSIS — N179 Acute kidney failure, unspecified: Secondary | ICD-10-CM | POA: Diagnosis not present

## 2018-12-23 DIAGNOSIS — A4150 Gram-negative sepsis, unspecified: Secondary | ICD-10-CM | POA: Diagnosis present

## 2018-12-23 DIAGNOSIS — R3914 Feeling of incomplete bladder emptying: Secondary | ICD-10-CM | POA: Diagnosis present

## 2018-12-23 DIAGNOSIS — M5416 Radiculopathy, lumbar region: Secondary | ICD-10-CM | POA: Diagnosis present

## 2018-12-23 DIAGNOSIS — R404 Transient alteration of awareness: Secondary | ICD-10-CM | POA: Diagnosis not present

## 2018-12-23 DIAGNOSIS — J90 Pleural effusion, not elsewhere classified: Secondary | ICD-10-CM | POA: Diagnosis not present

## 2018-12-23 DIAGNOSIS — R52 Pain, unspecified: Secondary | ICD-10-CM | POA: Diagnosis not present

## 2018-12-23 DIAGNOSIS — Z515 Encounter for palliative care: Secondary | ICD-10-CM | POA: Diagnosis not present

## 2018-12-23 DIAGNOSIS — N184 Chronic kidney disease, stage 4 (severe): Secondary | ICD-10-CM | POA: Diagnosis not present

## 2018-12-23 DIAGNOSIS — R410 Disorientation, unspecified: Secondary | ICD-10-CM | POA: Diagnosis not present

## 2018-12-23 DIAGNOSIS — I119 Hypertensive heart disease without heart failure: Secondary | ICD-10-CM | POA: Diagnosis not present

## 2018-12-23 DIAGNOSIS — G9340 Encephalopathy, unspecified: Secondary | ICD-10-CM | POA: Diagnosis not present

## 2018-12-23 DIAGNOSIS — N318 Other neuromuscular dysfunction of bladder: Secondary | ICD-10-CM | POA: Diagnosis not present

## 2018-12-23 DIAGNOSIS — G629 Polyneuropathy, unspecified: Secondary | ICD-10-CM | POA: Diagnosis present

## 2018-12-23 DIAGNOSIS — L89620 Pressure ulcer of left heel, unstageable: Secondary | ICD-10-CM | POA: Diagnosis not present

## 2018-12-23 DIAGNOSIS — R8290 Unspecified abnormal findings in urine: Secondary | ICD-10-CM | POA: Diagnosis not present

## 2018-12-23 DIAGNOSIS — L89612 Pressure ulcer of right heel, stage 2: Secondary | ICD-10-CM | POA: Diagnosis not present

## 2018-12-23 DIAGNOSIS — I160 Hypertensive urgency: Secondary | ICD-10-CM | POA: Diagnosis not present

## 2018-12-23 DIAGNOSIS — D631 Anemia in chronic kidney disease: Secondary | ICD-10-CM | POA: Diagnosis not present

## 2018-12-23 DIAGNOSIS — F0390 Unspecified dementia without behavioral disturbance: Secondary | ICD-10-CM | POA: Diagnosis not present

## 2018-12-23 DIAGNOSIS — R262 Difficulty in walking, not elsewhere classified: Secondary | ICD-10-CM | POA: Diagnosis not present

## 2018-12-23 DIAGNOSIS — N401 Enlarged prostate with lower urinary tract symptoms: Secondary | ICD-10-CM | POA: Diagnosis present

## 2018-12-23 DIAGNOSIS — J9601 Acute respiratory failure with hypoxia: Secondary | ICD-10-CM | POA: Diagnosis present

## 2018-12-23 DIAGNOSIS — N390 Urinary tract infection, site not specified: Secondary | ICD-10-CM | POA: Diagnosis not present

## 2018-12-23 DIAGNOSIS — N3000 Acute cystitis without hematuria: Secondary | ICD-10-CM | POA: Diagnosis not present

## 2018-12-23 DIAGNOSIS — A419 Sepsis, unspecified organism: Secondary | ICD-10-CM | POA: Diagnosis not present

## 2018-12-23 DIAGNOSIS — Z20828 Contact with and (suspected) exposure to other viral communicable diseases: Secondary | ICD-10-CM | POA: Diagnosis present

## 2018-12-23 DIAGNOSIS — E876 Hypokalemia: Secondary | ICD-10-CM | POA: Diagnosis present

## 2018-12-23 DIAGNOSIS — J156 Pneumonia due to other aerobic Gram-negative bacteria: Secondary | ICD-10-CM | POA: Diagnosis present

## 2018-12-23 DIAGNOSIS — R532 Functional quadriplegia: Secondary | ICD-10-CM | POA: Diagnosis present

## 2018-12-23 DIAGNOSIS — G9341 Metabolic encephalopathy: Secondary | ICD-10-CM | POA: Diagnosis present

## 2018-12-23 DIAGNOSIS — J189 Pneumonia, unspecified organism: Secondary | ICD-10-CM | POA: Diagnosis not present

## 2018-12-23 DIAGNOSIS — N170 Acute kidney failure with tubular necrosis: Secondary | ICD-10-CM | POA: Diagnosis present

## 2018-12-23 DIAGNOSIS — R0902 Hypoxemia: Secondary | ICD-10-CM | POA: Diagnosis not present

## 2018-12-23 DIAGNOSIS — I5032 Chronic diastolic (congestive) heart failure: Secondary | ICD-10-CM | POA: Diagnosis not present

## 2018-12-23 DIAGNOSIS — L89622 Pressure ulcer of left heel, stage 2: Secondary | ICD-10-CM | POA: Diagnosis not present

## 2018-12-23 DIAGNOSIS — R6521 Severe sepsis with septic shock: Secondary | ICD-10-CM | POA: Diagnosis present

## 2018-12-23 DIAGNOSIS — R4182 Altered mental status, unspecified: Secondary | ICD-10-CM | POA: Diagnosis not present

## 2018-12-23 DIAGNOSIS — M2550 Pain in unspecified joint: Secondary | ICD-10-CM | POA: Diagnosis not present

## 2018-12-23 DIAGNOSIS — I129 Hypertensive chronic kidney disease with stage 1 through stage 4 chronic kidney disease, or unspecified chronic kidney disease: Secondary | ICD-10-CM | POA: Diagnosis present

## 2018-12-23 DIAGNOSIS — F05 Delirium due to known physiological condition: Secondary | ICD-10-CM | POA: Diagnosis not present

## 2018-12-28 DIAGNOSIS — F0390 Unspecified dementia without behavioral disturbance: Secondary | ICD-10-CM | POA: Diagnosis not present

## 2018-12-28 DIAGNOSIS — R262 Difficulty in walking, not elsewhere classified: Secondary | ICD-10-CM | POA: Diagnosis not present

## 2018-12-28 DIAGNOSIS — I119 Hypertensive heart disease without heart failure: Secondary | ICD-10-CM | POA: Diagnosis not present

## 2018-12-28 DIAGNOSIS — N184 Chronic kidney disease, stage 4 (severe): Secondary | ICD-10-CM | POA: Diagnosis not present

## 2019-01-02 IMAGING — CT CT CERVICAL SPINE W/O CM
2 of 16 series · 7 of 33 positions shown, 9 images · non-contrast
Comparison: None.

CLINICAL DATA: 89-year-old male with low back pain and un steady
gait. Left-sided weakness for 2-3 months. History of L4-5 fusion
04/17/2016. No history cancer. Initial encounter.

EXAM:
CT CERVICAL AND LUMBAR SPINE WITHOUT CONTRAST
TECHNIQUE: Multidetector CT imaging of the cervical and lumbar spine was
performed without intravenous contrast. Multiplanar CT image
reconstructions were also generated.

[Series 202: sag · sagittal · 0.43mm/px · 5 of 71 slices shown, 6 images]
[im 24/71  bone]
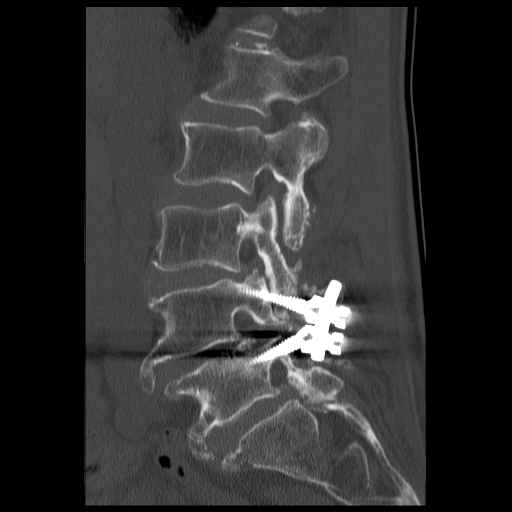
[im 30/71  bone]
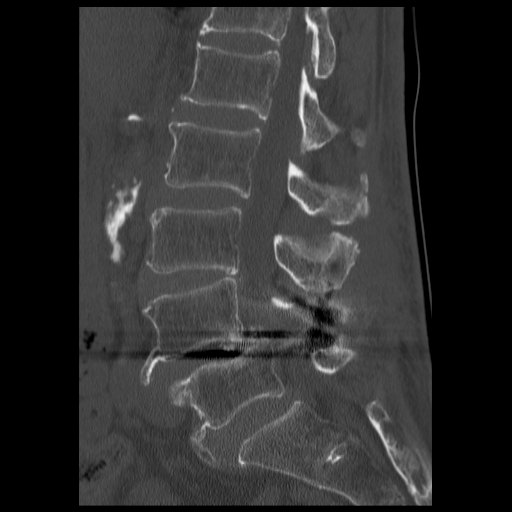
[im 36/71  soft-tissue]
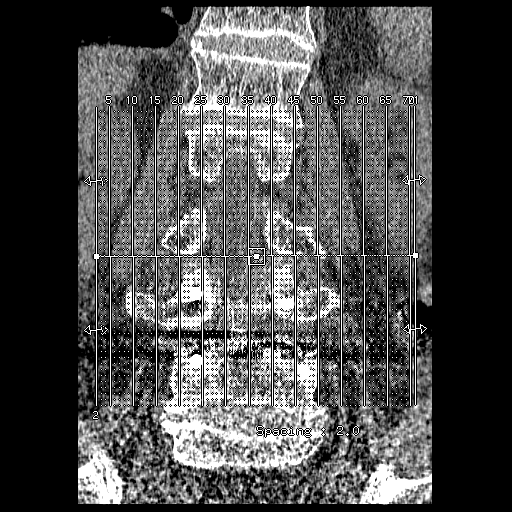
[im 36/71  bone]
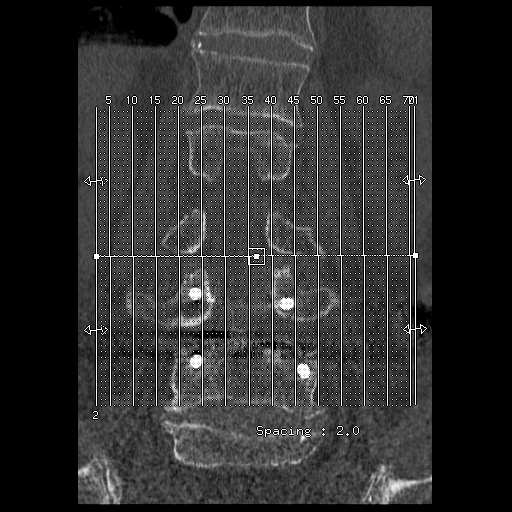
[im 41/71  bone]
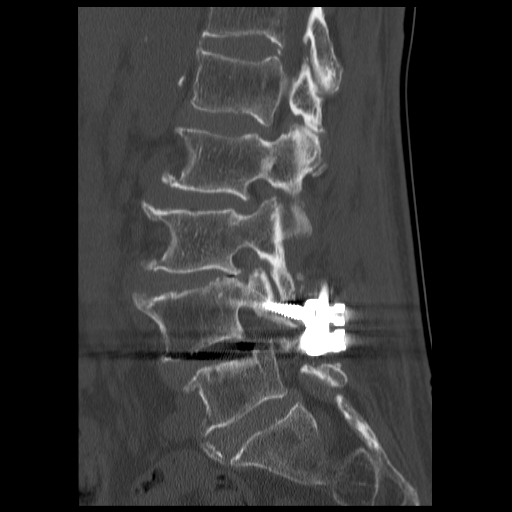
[im 47/71  bone]
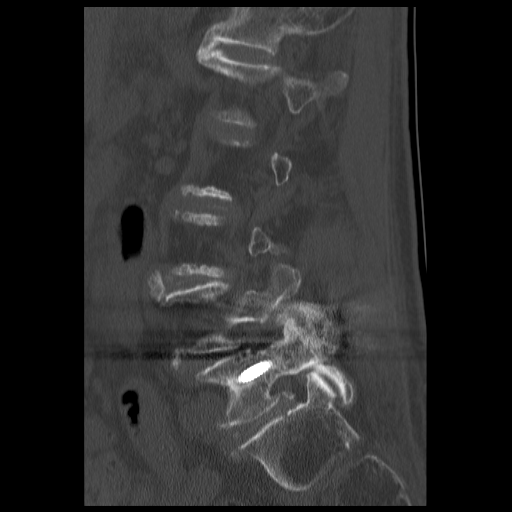

[Series 602: angled axial · axial · 0.23mm/px · z∈[+311,+380]mm · 2 of 300 slices shown, 3 images]
[im 100/300  soft-tissue]
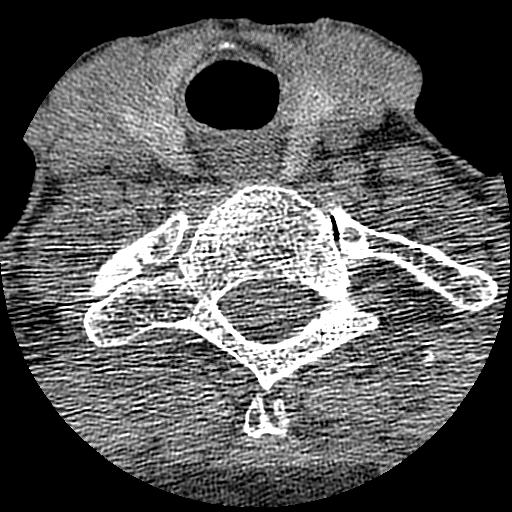
[im 100/300  bone]
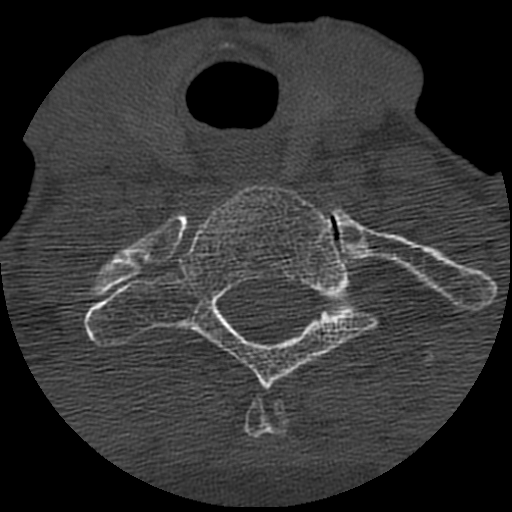
[im 200/300  bone]
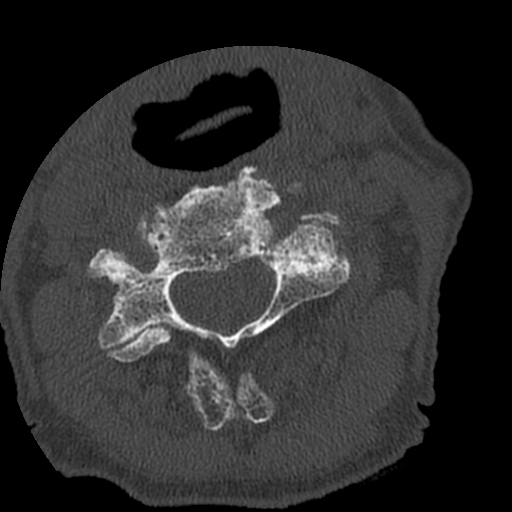

[7 of 33 positions shown; findings below may reference images not displayed]

FINDINGS: CT CERVICAL SPINE FINDINGS

Alignment: Mild scoliosis convex right.

Skull base and vertebrae: Sclerosis T4 lamina and base of the
spinous process of indeterminate etiology.

Soft tissues and spinal canal: No abnormal prevertebral soft tissue
swelling. No neck mass identified.

Disc levels: C1-2: Mild degenerative changes. Mild transverse
ligament prominence with slight impression ventral thecal sac.

C2-3: Mild right facet degenerative changes. No significant spinal
stenosis or foraminal narrowing.

C3-4: Facet degenerative changes. Broad-based disc osteophyte
complex. Mild to slightly moderate spinal stenosis with mild cord
flattening. Moderate-to-marked bilateral foraminal narrowing.

C4-5: Left facet degenerative changes. Minimal anterior slip and
rotation C4. Broad-based disc osteophyte complex slightly greater to
right. Narrowing ventral thecal sac with minimal cord contact.
Moderate bilateral foraminal narrowing.

C5-6: Broad-based disc osteophyte complex greater to the right. Mild
narrowing ventral thecal sac. Minimal cord flattening. Uncinate
hypertrophy greater on the right. Marked right-sided and moderate
left-sided foraminal narrowing.

C6-7: Broad-based disc osteophyte complex greater to the right. Mild
narrowing ventral thecal sac with minimal right-sided cord contact.
Uncinate hypertrophy. Minimal right-sided and moderate left-sided
foraminal narrowing.

C7-T1:  Mild bulge.  Mild narrowing ventral thecal sac.

T1-2 through T3-4 without significant spinal stenosis or foraminal
narrowing.

Upper chest: Apical scarring bilaterally.

CT LUMBAR SPINE FINDINGS

Segmentation: Last fully open disk space is labeled L5-S1. Present
examination incorporates from T12-L1 disc space through the S2
level.

Alignment: 9 mm anterior slip L4.

Vertebrae: No acute compression fracture.

Paraspinal and other soft tissues: Possible scarring right lung
base. Atherosclerotic changes aorta and iliac arteries.

Disc levels: T12-L1: No significant spinal stenosis or foraminal
narrowing.

L1-2: Mild bulge and rotation. No significant spinal stenosis or
foraminal narrowing.

L2-3: Facet degenerative changes with ligamentum flavum hypertrophy.
Bulge. Mild spinal stenosis. Foraminal narrowing greater on the
left.

L3-4: Facet degenerative changes. Ligamentum flavum hypertrophy.
Bulge. Multifactorial moderate spinal stenosis. Superimposed gas
containing left paracentral caudally extending small disc.

L4-5: Prior laminectomy and attempted fusion. L5 pedicle screws may
be loose. Subsiding of the interbody spacer with posteriorly located
right interbody spacer which could cause nerve root compression.
Evaluation of the thecal sac limited secondary to streak artifact.
Narrowing of the inferior aspect of the neural foramen bilaterally.

L5-S1: Bulge and osteophyte with foraminal extension greater on the
right with mass effect upon the exiting right L5 nerve root and mild
encroachment upon the exiting left L5 nerve root. Facet bony
overgrowth greater on the right. Bulge. Mild right lateral recess
narrowing.
IMPRESSION: CT CERVICAL SPINE

Mild scoliosis convex right.

Multilevel cervical spondylotic changes most prominent C3-4 level as
detailed above.

Sclerosis T4 lamina and base of the spinous process of indeterminate
etiology.

CT LUMBAR SPINE

L4-5 prior laminectomy and attempted fusion. 9 mm anterior slip L4.
L5 pedicle screws may be loose. Subsiding of the interbody spacer
with posteriorly located right interbody spacer which could cause
nerve root compression. Evaluation of the thecal sac limited
secondary to streak artifact. Narrowing of the inferior aspect of
the neural foramen bilaterally.

L5-S1 bulge and osteophyte with foraminal extension greater on the
right with mass effect upon the exiting right L5 nerve root and mild
encroachment upon the exiting left L5 nerve root. Mild right lateral
recess narrowing.

L3-4 baseline multifactorial moderate spinal stenosis. Superimposed
gas containing left paracentral caudally extending small disc.

L2-3 mild spinal stenosis. Mild foraminal narrowing greater on the
left.

## 2019-01-09 DIAGNOSIS — R338 Other retention of urine: Secondary | ICD-10-CM | POA: Diagnosis not present

## 2019-01-09 DIAGNOSIS — N3000 Acute cystitis without hematuria: Secondary | ICD-10-CM | POA: Diagnosis not present

## 2019-01-09 DIAGNOSIS — N401 Enlarged prostate with lower urinary tract symptoms: Secondary | ICD-10-CM | POA: Diagnosis not present

## 2019-01-10 DIAGNOSIS — L89612 Pressure ulcer of right heel, stage 2: Secondary | ICD-10-CM | POA: Diagnosis not present

## 2019-01-10 DIAGNOSIS — L89620 Pressure ulcer of left heel, unstageable: Secondary | ICD-10-CM | POA: Diagnosis not present

## 2019-01-16 DIAGNOSIS — N3020 Other chronic cystitis without hematuria: Secondary | ICD-10-CM | POA: Diagnosis not present

## 2019-01-16 DIAGNOSIS — R351 Nocturia: Secondary | ICD-10-CM | POA: Diagnosis not present

## 2019-01-16 DIAGNOSIS — N318 Other neuromuscular dysfunction of bladder: Secondary | ICD-10-CM | POA: Diagnosis not present

## 2019-01-16 DIAGNOSIS — N401 Enlarged prostate with lower urinary tract symptoms: Secondary | ICD-10-CM | POA: Diagnosis not present

## 2019-01-17 DIAGNOSIS — L89612 Pressure ulcer of right heel, stage 2: Secondary | ICD-10-CM | POA: Diagnosis not present

## 2019-01-17 DIAGNOSIS — L89622 Pressure ulcer of left heel, stage 2: Secondary | ICD-10-CM | POA: Diagnosis not present

## 2019-01-18 ENCOUNTER — Other Ambulatory Visit: Payer: Self-pay | Admitting: *Deleted

## 2019-01-18 NOTE — Patient Outreach (Signed)
Member assessed for potential [facility] Care Management needs as a benefit of  [place] [employer].  Member is currently receiving rehab therapy at [facility].  Member discussed in weekly telephonic IDT meeting with facility staff, [facility] UM team, and writer.  Facility reports disposition plans are for member to return home with wife. Facility reports they have attempted to emphasize to Mrs. Klimaszewski that member will need additional caregiver assistance upon returning home.  Facility states they have discussed [place] Management follow up with wife post SNF discharge. States Mrs. Faulds will be expecting writer's call.  Facility Psychologist, clinical to hold off on calling Mrs. Gajda until  appeal decision has been rendered. Otherwise, planned discharge is for tomorrow 01/19/19.  Will continue to collaborate with facility staff and [facility] UM team on member.   Will plan to outreach to Mrs. Akter about [facility] Care Management closer to SNF discharge.   Marthenia Rolling, MSN-Ed, RN,BSN [facility] Coordinator [phone] [facility]) [phone]  (Toll free office)

## 2019-01-22 DIAGNOSIS — F05 Delirium due to known physiological condition: Secondary | ICD-10-CM | POA: Diagnosis not present

## 2019-01-22 DIAGNOSIS — J9601 Acute respiratory failure with hypoxia: Secondary | ICD-10-CM | POA: Diagnosis present

## 2019-01-22 DIAGNOSIS — M5416 Radiculopathy, lumbar region: Secondary | ICD-10-CM | POA: Diagnosis present

## 2019-01-22 DIAGNOSIS — N183 Chronic kidney disease, stage 3 (moderate): Secondary | ICD-10-CM | POA: Diagnosis present

## 2019-01-22 DIAGNOSIS — R532 Functional quadriplegia: Secondary | ICD-10-CM | POA: Diagnosis present

## 2019-01-22 DIAGNOSIS — R1310 Dysphagia, unspecified: Secondary | ICD-10-CM | POA: Diagnosis not present

## 2019-01-22 DIAGNOSIS — N261 Atrophy of kidney (terminal): Secondary | ICD-10-CM | POA: Diagnosis not present

## 2019-01-22 DIAGNOSIS — Z20828 Contact with and (suspected) exposure to other viral communicable diseases: Secondary | ICD-10-CM | POA: Diagnosis present

## 2019-01-22 DIAGNOSIS — R05 Cough: Secondary | ICD-10-CM | POA: Diagnosis not present

## 2019-01-22 DIAGNOSIS — G9341 Metabolic encephalopathy: Secondary | ICD-10-CM | POA: Diagnosis present

## 2019-01-22 DIAGNOSIS — R2689 Other abnormalities of gait and mobility: Secondary | ICD-10-CM | POA: Diagnosis present

## 2019-01-22 DIAGNOSIS — Z452 Encounter for adjustment and management of vascular access device: Secondary | ICD-10-CM | POA: Diagnosis not present

## 2019-01-22 DIAGNOSIS — Z515 Encounter for palliative care: Secondary | ICD-10-CM | POA: Diagnosis not present

## 2019-01-22 DIAGNOSIS — R188 Other ascites: Secondary | ICD-10-CM | POA: Diagnosis not present

## 2019-01-22 DIAGNOSIS — T17320A Food in larynx causing asphyxiation, initial encounter: Secondary | ICD-10-CM | POA: Diagnosis not present

## 2019-01-22 DIAGNOSIS — R069 Unspecified abnormalities of breathing: Secondary | ICD-10-CM | POA: Diagnosis not present

## 2019-01-22 DIAGNOSIS — I129 Hypertensive chronic kidney disease with stage 1 through stage 4 chronic kidney disease, or unspecified chronic kidney disease: Secondary | ICD-10-CM | POA: Diagnosis present

## 2019-01-22 DIAGNOSIS — N179 Acute kidney failure, unspecified: Secondary | ICD-10-CM | POA: Diagnosis not present

## 2019-01-22 DIAGNOSIS — R3914 Feeling of incomplete bladder emptying: Secondary | ICD-10-CM | POA: Diagnosis present

## 2019-01-22 DIAGNOSIS — N289 Disorder of kidney and ureter, unspecified: Secondary | ICD-10-CM | POA: Diagnosis not present

## 2019-01-22 DIAGNOSIS — R0603 Acute respiratory distress: Secondary | ICD-10-CM | POA: Diagnosis not present

## 2019-01-22 DIAGNOSIS — J90 Pleural effusion, not elsewhere classified: Secondary | ICD-10-CM | POA: Diagnosis not present

## 2019-01-22 DIAGNOSIS — R918 Other nonspecific abnormal finding of lung field: Secondary | ICD-10-CM | POA: Diagnosis not present

## 2019-01-22 DIAGNOSIS — R410 Disorientation, unspecified: Secondary | ICD-10-CM | POA: Diagnosis not present

## 2019-01-22 DIAGNOSIS — N401 Enlarged prostate with lower urinary tract symptoms: Secondary | ICD-10-CM | POA: Diagnosis present

## 2019-01-22 DIAGNOSIS — Z66 Do not resuscitate: Secondary | ICD-10-CM | POA: Diagnosis present

## 2019-01-22 DIAGNOSIS — A419 Sepsis, unspecified organism: Secondary | ICD-10-CM | POA: Diagnosis not present

## 2019-01-22 DIAGNOSIS — R404 Transient alteration of awareness: Secondary | ICD-10-CM | POA: Diagnosis not present

## 2019-01-22 DIAGNOSIS — Z992 Dependence on renal dialysis: Secondary | ICD-10-CM | POA: Diagnosis not present

## 2019-01-22 DIAGNOSIS — Z9911 Dependence on respirator [ventilator] status: Secondary | ICD-10-CM | POA: Diagnosis not present

## 2019-01-22 DIAGNOSIS — R0602 Shortness of breath: Secondary | ICD-10-CM | POA: Diagnosis not present

## 2019-01-22 DIAGNOSIS — Z7401 Bed confinement status: Secondary | ICD-10-CM | POA: Diagnosis not present

## 2019-01-22 DIAGNOSIS — G629 Polyneuropathy, unspecified: Secondary | ICD-10-CM | POA: Diagnosis present

## 2019-01-22 DIAGNOSIS — R52 Pain, unspecified: Secondary | ICD-10-CM | POA: Diagnosis not present

## 2019-01-22 DIAGNOSIS — N170 Acute kidney failure with tubular necrosis: Secondary | ICD-10-CM | POA: Diagnosis present

## 2019-01-22 DIAGNOSIS — Z4682 Encounter for fitting and adjustment of non-vascular catheter: Secondary | ICD-10-CM | POA: Diagnosis not present

## 2019-01-22 DIAGNOSIS — A4150 Gram-negative sepsis, unspecified: Secondary | ICD-10-CM | POA: Diagnosis present

## 2019-01-22 DIAGNOSIS — J156 Pneumonia due to other aerobic Gram-negative bacteria: Secondary | ICD-10-CM | POA: Diagnosis present

## 2019-01-22 DIAGNOSIS — R6521 Severe sepsis with septic shock: Secondary | ICD-10-CM | POA: Diagnosis present

## 2019-01-22 DIAGNOSIS — E876 Hypokalemia: Secondary | ICD-10-CM | POA: Diagnosis present

## 2019-01-22 DIAGNOSIS — D631 Anemia in chronic kidney disease: Secondary | ICD-10-CM | POA: Diagnosis not present

## 2019-01-22 DIAGNOSIS — E872 Acidosis: Secondary | ICD-10-CM | POA: Diagnosis present

## 2019-01-22 DIAGNOSIS — J811 Chronic pulmonary edema: Secondary | ICD-10-CM | POA: Diagnosis not present

## 2019-01-22 DIAGNOSIS — E870 Hyperosmolality and hypernatremia: Secondary | ICD-10-CM | POA: Diagnosis not present

## 2019-01-22 DIAGNOSIS — J189 Pneumonia, unspecified organism: Secondary | ICD-10-CM | POA: Diagnosis not present

## 2019-01-22 DIAGNOSIS — R4182 Altered mental status, unspecified: Secondary | ICD-10-CM | POA: Diagnosis not present

## 2019-01-23 DIAGNOSIS — A419 Sepsis, unspecified organism: Secondary | ICD-10-CM | POA: Diagnosis not present

## 2019-01-23 DIAGNOSIS — N179 Acute kidney failure, unspecified: Secondary | ICD-10-CM | POA: Diagnosis not present

## 2019-01-23 DIAGNOSIS — J189 Pneumonia, unspecified organism: Secondary | ICD-10-CM | POA: Diagnosis not present

## 2019-01-23 DIAGNOSIS — R0602 Shortness of breath: Secondary | ICD-10-CM | POA: Diagnosis not present

## 2019-01-24 DIAGNOSIS — J189 Pneumonia, unspecified organism: Secondary | ICD-10-CM | POA: Diagnosis not present

## 2019-01-25 DIAGNOSIS — N261 Atrophy of kidney (terminal): Secondary | ICD-10-CM | POA: Diagnosis not present

## 2019-01-25 DIAGNOSIS — R918 Other nonspecific abnormal finding of lung field: Secondary | ICD-10-CM | POA: Diagnosis not present

## 2019-01-25 DIAGNOSIS — A419 Sepsis, unspecified organism: Secondary | ICD-10-CM | POA: Diagnosis not present

## 2019-01-25 DIAGNOSIS — N179 Acute kidney failure, unspecified: Secondary | ICD-10-CM | POA: Diagnosis not present

## 2019-01-25 DIAGNOSIS — J9601 Acute respiratory failure with hypoxia: Secondary | ICD-10-CM | POA: Diagnosis not present

## 2019-01-29 DIAGNOSIS — N179 Acute kidney failure, unspecified: Secondary | ICD-10-CM | POA: Diagnosis not present

## 2019-01-29 DIAGNOSIS — R6521 Severe sepsis with septic shock: Secondary | ICD-10-CM | POA: Diagnosis not present

## 2019-01-29 DIAGNOSIS — J189 Pneumonia, unspecified organism: Secondary | ICD-10-CM | POA: Diagnosis not present

## 2019-01-30 DIAGNOSIS — J189 Pneumonia, unspecified organism: Secondary | ICD-10-CM | POA: Diagnosis not present

## 2019-01-30 DIAGNOSIS — R4182 Altered mental status, unspecified: Secondary | ICD-10-CM | POA: Diagnosis not present

## 2019-01-31 DIAGNOSIS — J9601 Acute respiratory failure with hypoxia: Secondary | ICD-10-CM | POA: Diagnosis not present

## 2019-01-31 DIAGNOSIS — J90 Pleural effusion, not elsewhere classified: Secondary | ICD-10-CM | POA: Diagnosis not present

## 2019-02-03 DIAGNOSIS — N179 Acute kidney failure, unspecified: Secondary | ICD-10-CM | POA: Diagnosis not present

## 2019-02-03 DIAGNOSIS — J9601 Acute respiratory failure with hypoxia: Secondary | ICD-10-CM | POA: Diagnosis not present

## 2019-02-04 DIAGNOSIS — Z9911 Dependence on respirator [ventilator] status: Secondary | ICD-10-CM | POA: Diagnosis not present

## 2019-02-04 DIAGNOSIS — A419 Sepsis, unspecified organism: Secondary | ICD-10-CM | POA: Diagnosis not present

## 2019-02-04 DIAGNOSIS — J189 Pneumonia, unspecified organism: Secondary | ICD-10-CM | POA: Diagnosis not present

## 2019-02-06 DIAGNOSIS — J9601 Acute respiratory failure with hypoxia: Secondary | ICD-10-CM | POA: Diagnosis not present

## 2019-02-21 DEATH — deceased
